# Patient Record
Sex: Female | Born: 1965 | Race: White | Hispanic: No | Marital: Married | Smoking: Never smoker
Health system: Southern US, Community
[De-identification: ages and names within clinical notes are randomized; demographics above are authoritative.]

## PROBLEM LIST (undated history)

## (undated) DIAGNOSIS — F439 Reaction to severe stress, unspecified: Secondary | ICD-10-CM

## (undated) DIAGNOSIS — M2550 Pain in unspecified joint: Secondary | ICD-10-CM

## (undated) DIAGNOSIS — D219 Benign neoplasm of connective and other soft tissue, unspecified: Secondary | ICD-10-CM

## (undated) DIAGNOSIS — F3281 Premenstrual dysphoric disorder: Secondary | ICD-10-CM

## (undated) DIAGNOSIS — E789 Disorder of lipoprotein metabolism, unspecified: Secondary | ICD-10-CM

## (undated) DIAGNOSIS — Z9289 Personal history of other medical treatment: Secondary | ICD-10-CM

## (undated) DIAGNOSIS — E559 Vitamin D deficiency, unspecified: Secondary | ICD-10-CM

## (undated) DIAGNOSIS — Z6841 Body Mass Index (BMI) 40.0 and over, adult: Secondary | ICD-10-CM

## (undated) DIAGNOSIS — N920 Excessive and frequent menstruation with regular cycle: Secondary | ICD-10-CM

## (undated) HISTORY — PX: KNEE ARTHROSCOPY: SUR90

## (undated) HISTORY — DX: Personal history of other medical treatment: Z92.89

## (undated) HISTORY — DX: Benign neoplasm of connective and other soft tissue, unspecified: D21.9

## (undated) HISTORY — DX: Body Mass Index (BMI) 40.0 and over, adult: Z684

## (undated) HISTORY — DX: Premenstrual dysphoric disorder: F32.81

## (undated) HISTORY — DX: Vitamin D deficiency, unspecified: E55.9

## (undated) HISTORY — DX: Excessive and frequent menstruation with regular cycle: N92.0

## (undated) HISTORY — DX: Reaction to severe stress, unspecified: F43.9

## (undated) HISTORY — DX: Pain in unspecified joint: M25.50

## (undated) HISTORY — DX: Disorder of lipoprotein metabolism, unspecified: E78.9

---

## 2007-03-14 HISTORY — PX: CRYOABLATION: SHX1415

## 2009-10-25 ENCOUNTER — Ambulatory Visit: Payer: Self-pay | Admitting: Obstetrics and Gynecology

## 2009-10-31 ENCOUNTER — Inpatient Hospital Stay: Payer: Self-pay | Admitting: Obstetrics and Gynecology

## 2009-10-31 HISTORY — PX: CYSTOSCOPY: SUR368

## 2009-10-31 HISTORY — PX: ABDOMINAL HYSTERECTOMY: SHX81

## 2010-03-03 ENCOUNTER — Ambulatory Visit: Payer: Self-pay | Admitting: Unknown Physician Specialty

## 2010-03-26 ENCOUNTER — Ambulatory Visit: Payer: Self-pay | Admitting: Unknown Physician Specialty

## 2011-11-09 DIAGNOSIS — E559 Vitamin D deficiency, unspecified: Secondary | ICD-10-CM

## 2011-11-09 DIAGNOSIS — E789 Disorder of lipoprotein metabolism, unspecified: Secondary | ICD-10-CM

## 2011-11-09 HISTORY — DX: Vitamin D deficiency, unspecified: E55.9

## 2011-11-09 HISTORY — DX: Disorder of lipoprotein metabolism, unspecified: E78.9

## 2011-12-28 IMAGING — CT CT ABD-PELV W/ CM
1 of 3 series · 12 of 32 positions shown, 18 images · IV contrast (isovue)
Comparison: None

REASON FOR EXAM: COMMENTS:

PROCEDURE:     CT  - CT ABDOMEN / PELVIS  W  - November 01, 2009  [DATE]
RESULT:      History: Decreased urine output
TECHNIQUE: Multiple axial images of the abdomen and pelvis were performed
from the lung bases to the pubic symphysis, without p.o. contrast and with
100 ml of Isovue 370 intravenous contrast in the nephrographic and excretory
phases.

[Series 2: soft tissue · axial · 0.82mm/px · z∈[-534,-144]mm · 12 of 92 slices shown, 18 images]
[im 7/92  soft-tissue]
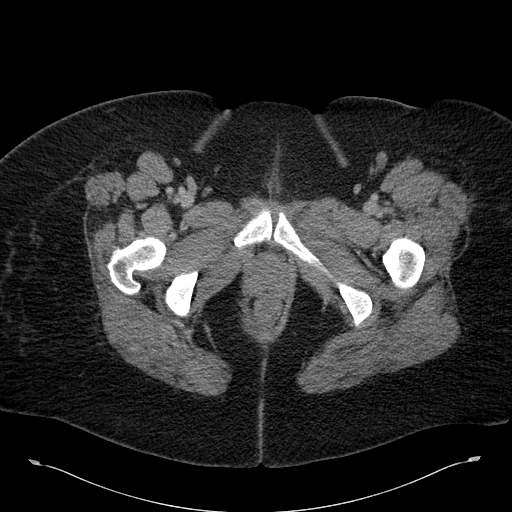
[im 7/92  bone]
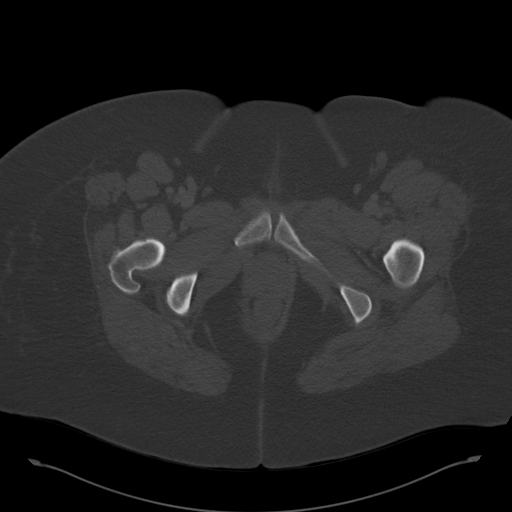
[im 14/92  soft-tissue]
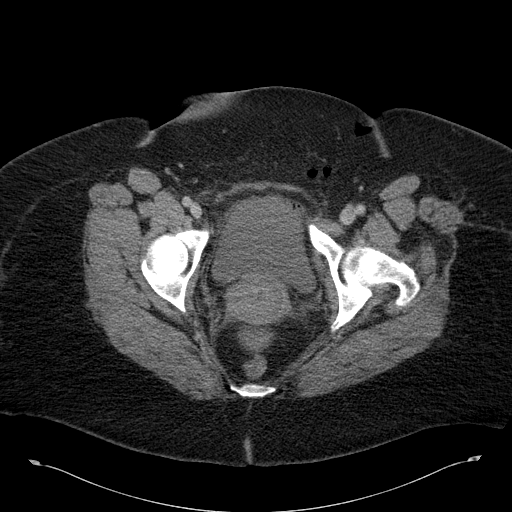
[im 20/92  soft-tissue]
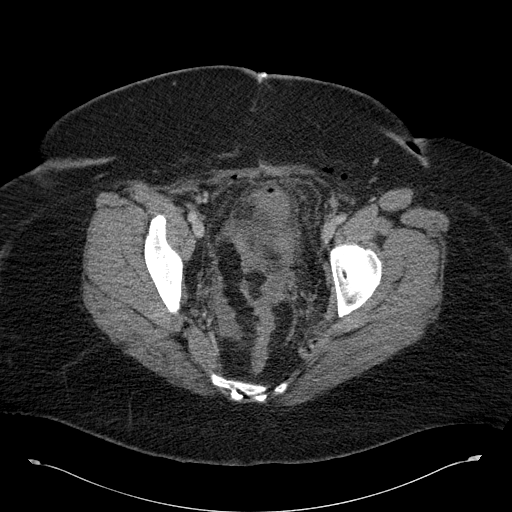
[im 27/92  soft-tissue]
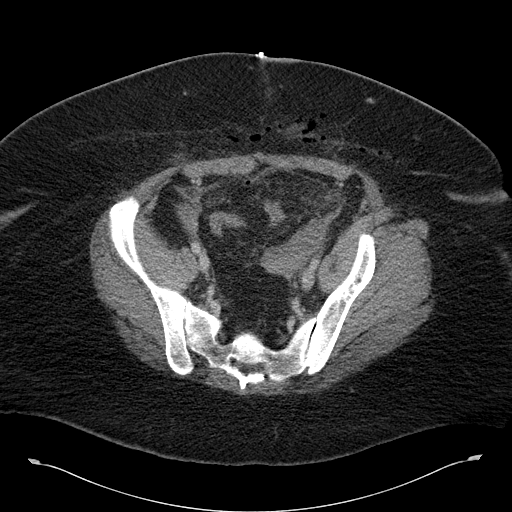
[im 33/92  soft-tissue]
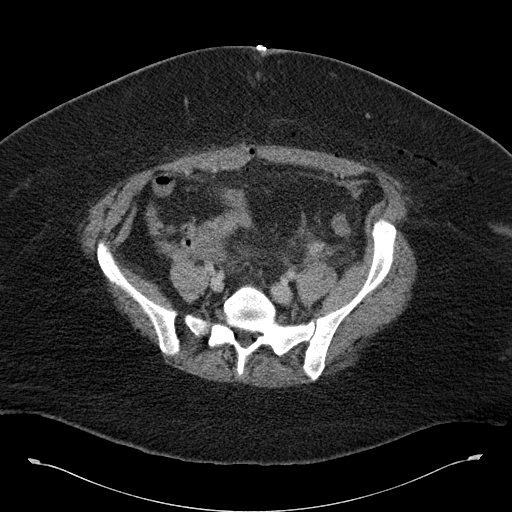
[im 40/92  soft-tissue]
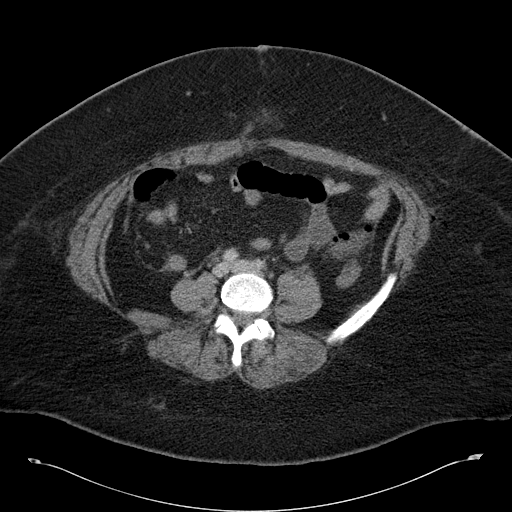
[im 53/92  soft-tissue]
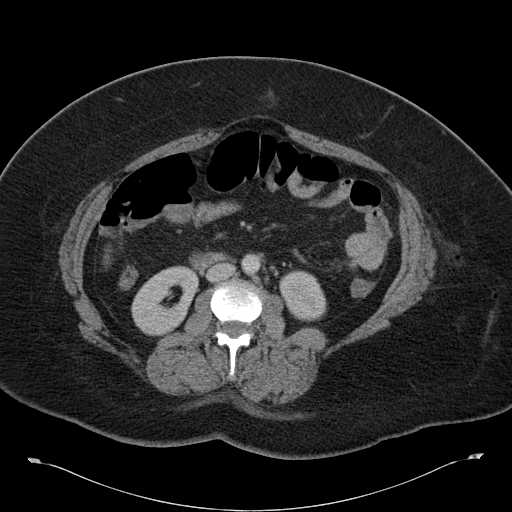
[im 59/92  soft-tissue]
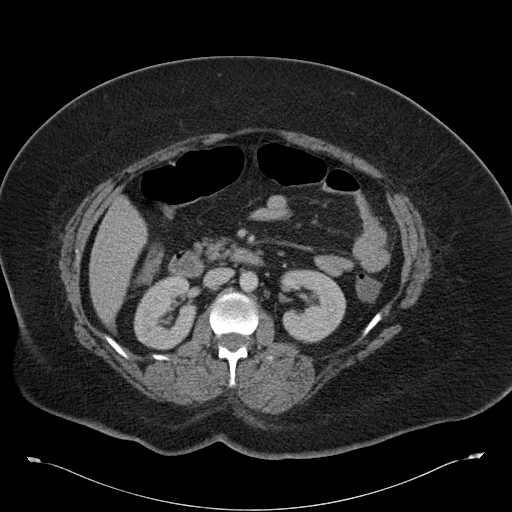
[im 66/92  soft-tissue]
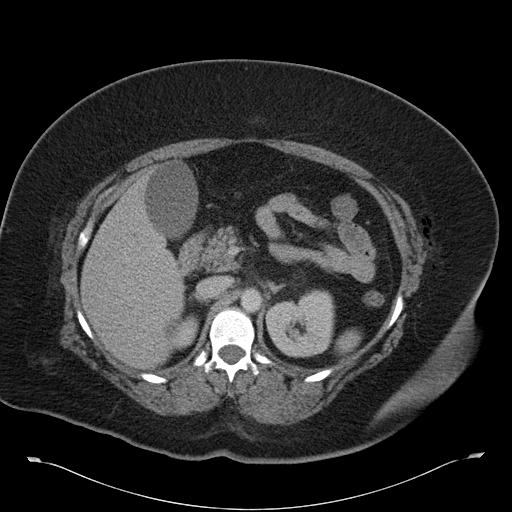
[im 66/92  lung]
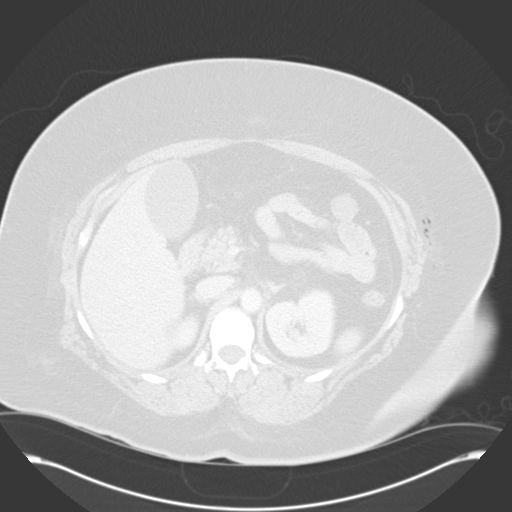
[im 66/92  bone]
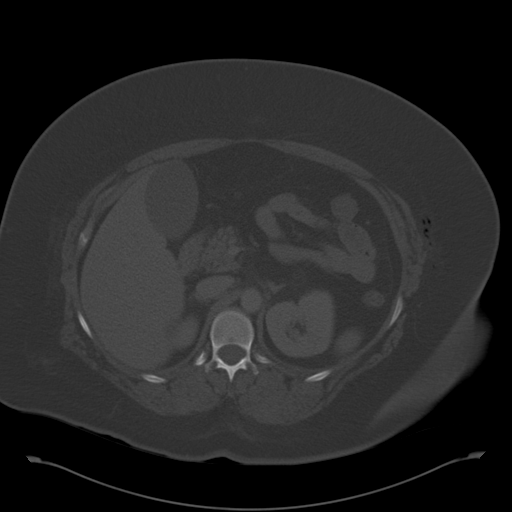
[im 72/92  soft-tissue]
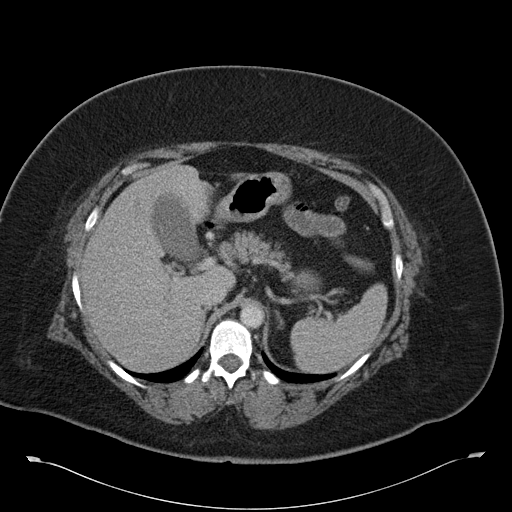
[im 72/92  lung]
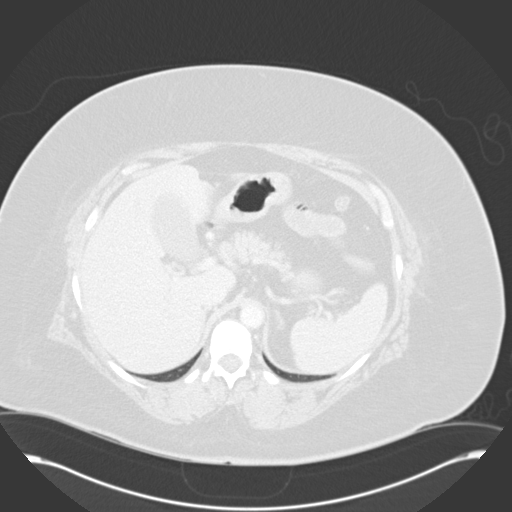
[im 79/92  soft-tissue]
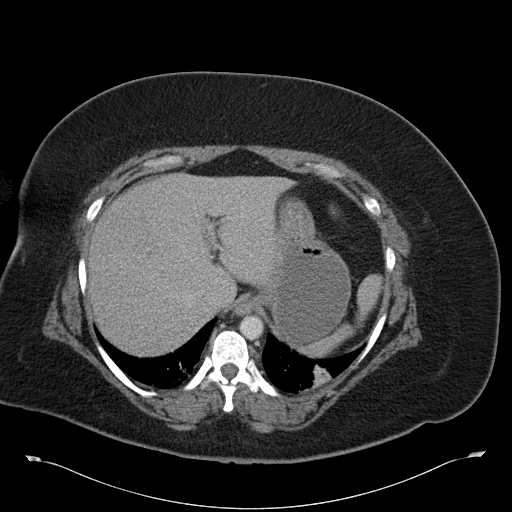
[im 79/92  lung]
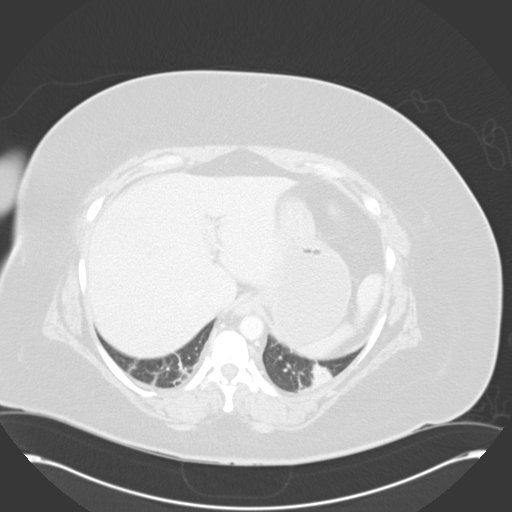
[im 85/92  soft-tissue]
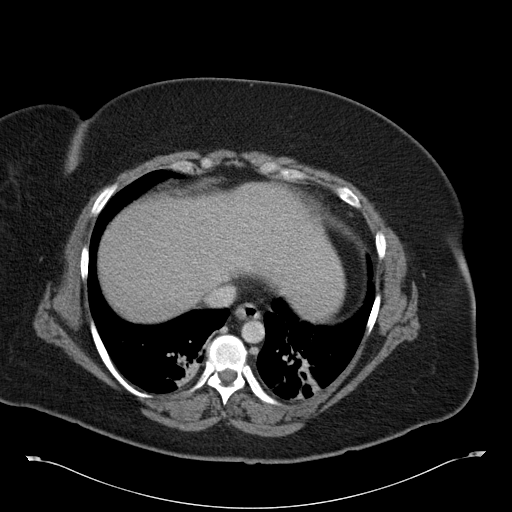
[im 85/92  lung]
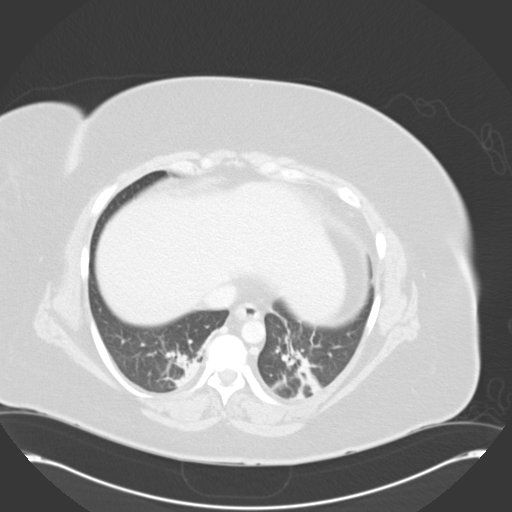

[12 of 32 positions shown; findings below may reference images not displayed]

FINDINGS: There is bibasilar airspace disease likely resenting atelectasis. There is
no pneumothorax. The heart size is normal.

The liver demonstrates no focal abnormality. There is no intrahepatic or
extrahepatic biliary ductal dilatation. The gallbladder is unremarkable. The
spleen demonstrates no focal abnormality. The kidneys, adrenal glands, and
pancreas are normal. The bladder is unremarkable. Bilateral ureters are
normal in size with contrast demonstrated throughout there courses. There is
no contrast extravasation.

There is high attenuation fluid in the left side of the upper and lower
pelvis likely representing a small amount of hemorrhage which is likely
related to recent surgery. There are postsurgical changes in the anterior
abdominal subcutaneous fat with multiple locules of air seen.

The unopacified stomach, duodenum, small intestine, and large intestine
demonstrate no gross abnormality.  There is no pneumoperitoneum,
pneumatosis, or portal venous gas. There is no abdominal or pelvic free
fluid. There is no lymphadenopathy.

The abdominal aorta is normal in caliber with atherosclerosis.

The osseous structures are unremarkable.
IMPRESSION: 1. There is no evidence of a ureteral or bladder injury. No contrast
extravasation. There is a single locule of air within the bladder which may
be secondary to recent instrumentation versus infection. Correlate with
clinical findings.

2. There is high attenuation fluid in the left side of the upper and lower
pelvis likely representing a small amount of hemorrhage which is likely
related to recent surgery. There are postsurgical changes in the anterior
abdominal subcutaneous fat with multiple locules of air seen.

3. There is bibasilar airspace disease likely resenting atelectasis.

## 2012-04-28 IMAGING — CR DG CHEST 2V
1 series · 2 of 2 positions shown · non-contrast
Comparison: none

REASON FOR EXAM: constant cough
COMMENTS:

[Series 1: view not recorded · 0.17mm/px · 2 of 2 slices shown]
[im 1/2]
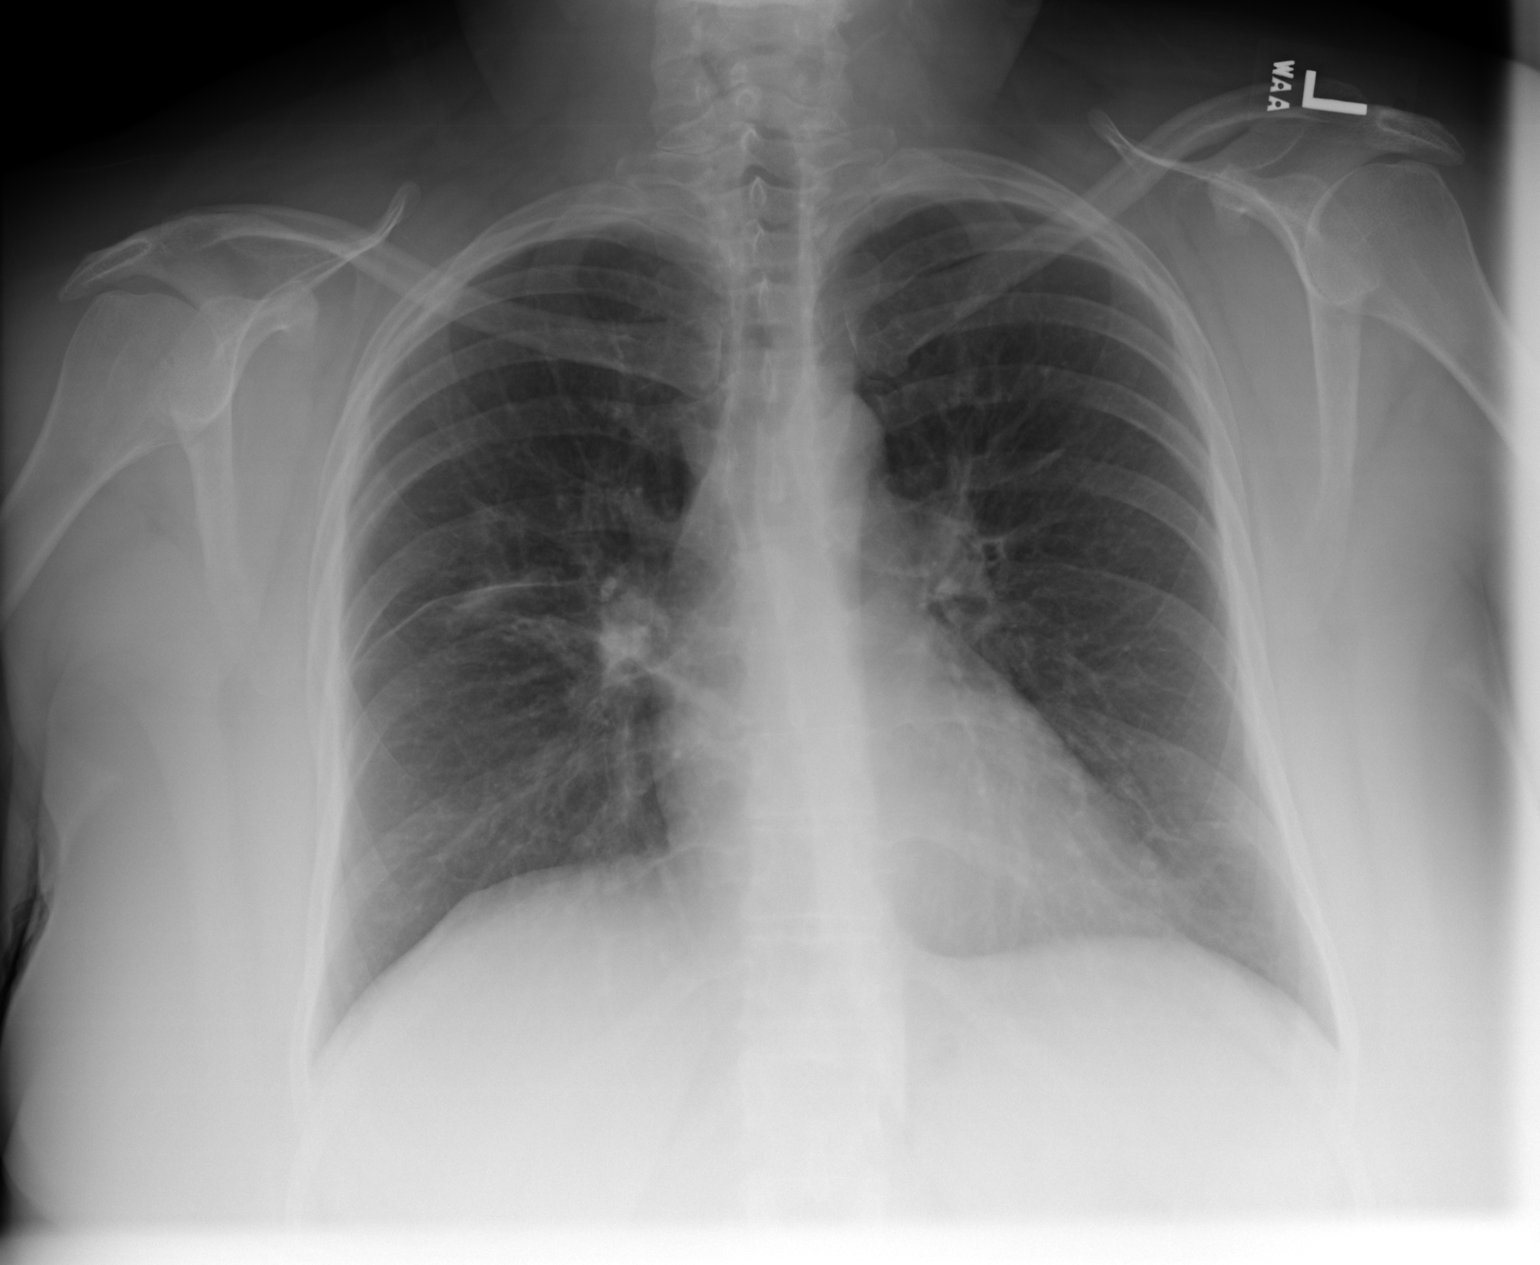
[im 2/2]
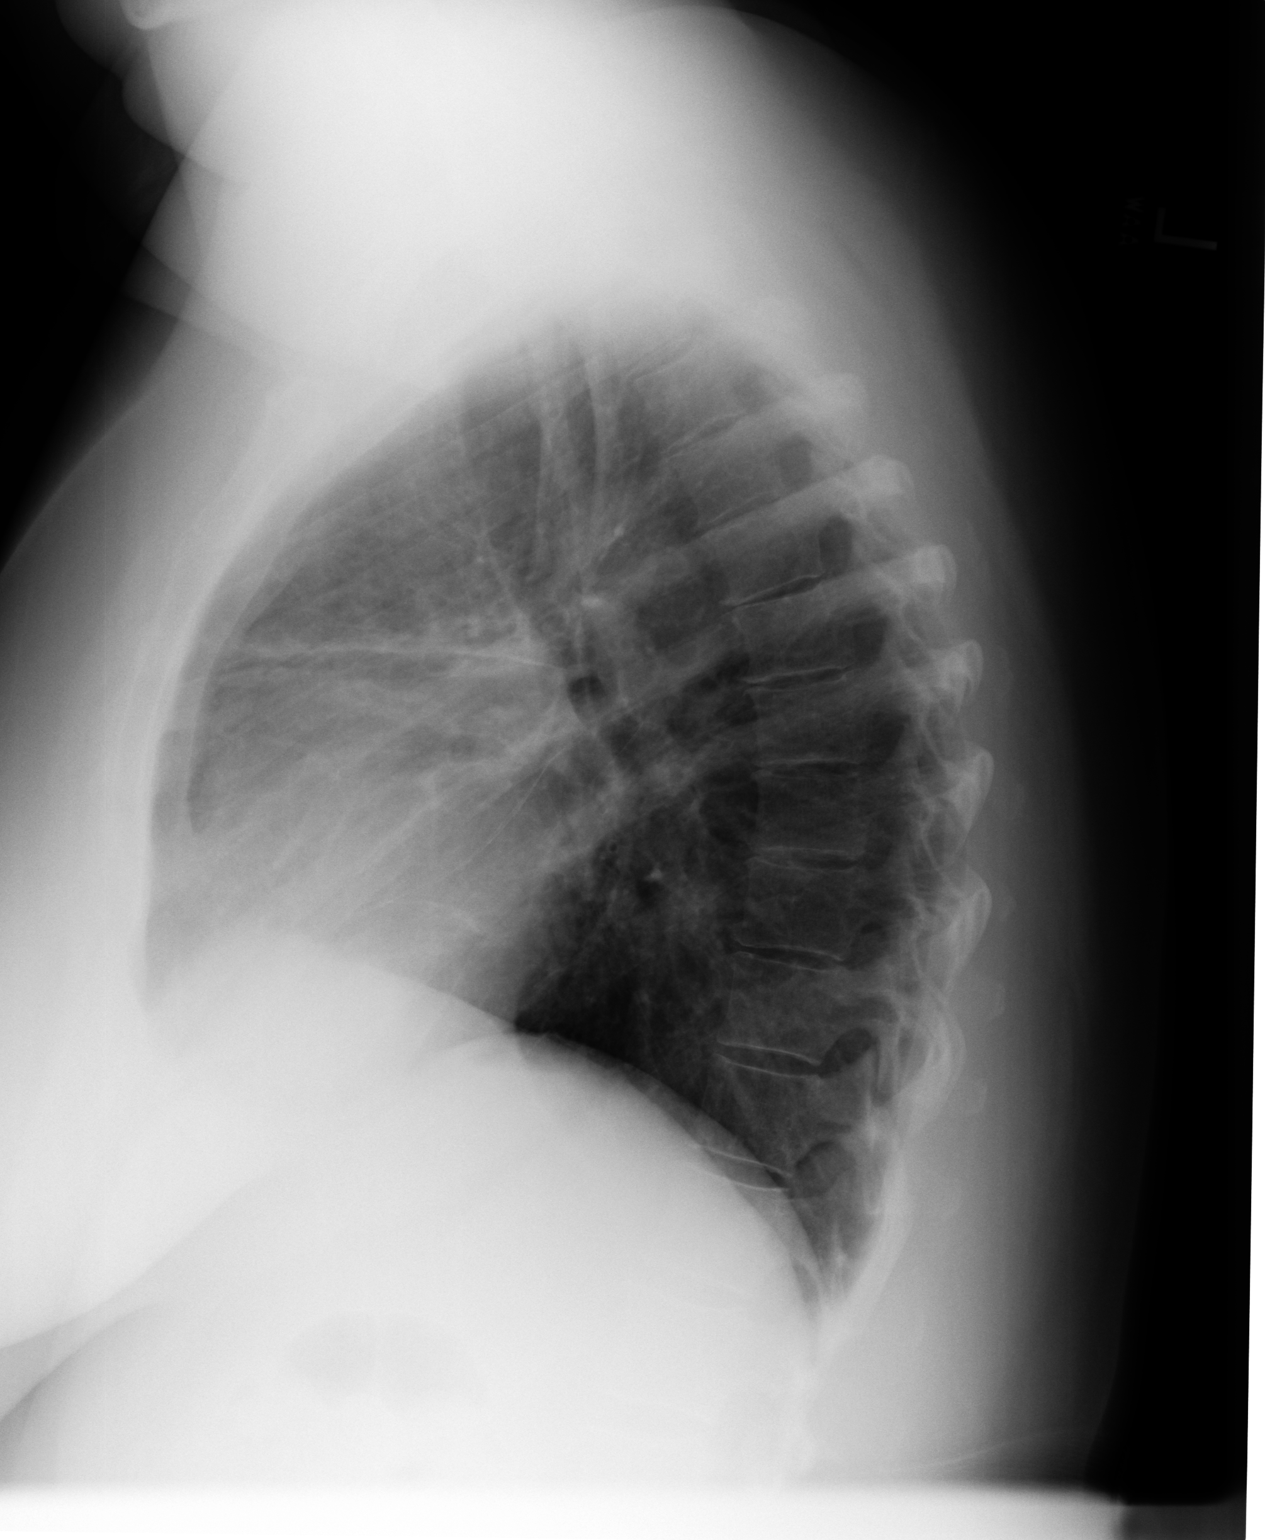

[2 of 2 positions shown; findings below may reference images not displayed]

PROCEDURE:     DXR - DXR CHEST PA (OR AP) AND LATERAL  - March 03, 2010  [DATE]

RESULT:     There is a patchy increase in density in the right mid lung
field compatible with atelectasis or pneumonia. The bibasilar infiltrates or
atelectasis, observed on the prior abdominal CT of 11/01/2009, is no longer
seen. The heart size is normal. The mediastinal and osseous structures are
normal in appearance.
IMPRESSION: 1.  There is increased density lateral to the right hilum consistent with
infiltrate or atelectasis. Follow-up examination until clear is recommended.
2.  The left lung field is clear.
3.  The heart size is normal.

## 2013-06-11 DIAGNOSIS — F3281 Premenstrual dysphoric disorder: Secondary | ICD-10-CM

## 2013-06-11 HISTORY — DX: Premenstrual dysphoric disorder: F32.81

## 2013-10-26 ENCOUNTER — Ambulatory Visit: Payer: Self-pay | Admitting: Podiatry

## 2015-06-18 DIAGNOSIS — I7300 Raynaud's syndrome without gangrene: Secondary | ICD-10-CM | POA: Insufficient documentation

## 2015-12-09 LAB — HM PAP SMEAR

## 2015-12-09 LAB — HM MAMMOGRAPHY

## 2016-12-21 ENCOUNTER — Encounter: Payer: Self-pay | Admitting: Obstetrics and Gynecology

## 2016-12-21 ENCOUNTER — Telehealth: Payer: Self-pay | Admitting: Obstetrics and Gynecology

## 2016-12-21 ENCOUNTER — Ambulatory Visit (INDEPENDENT_AMBULATORY_CARE_PROVIDER_SITE_OTHER): Payer: BC Managed Care – PPO | Admitting: Obstetrics and Gynecology

## 2016-12-21 VITALS — BP 128/78 | Ht 63.0 in | Wt 227.0 lb

## 2016-12-21 DIAGNOSIS — Z1231 Encounter for screening mammogram for malignant neoplasm of breast: Secondary | ICD-10-CM | POA: Diagnosis not present

## 2016-12-21 DIAGNOSIS — Z1239 Encounter for other screening for malignant neoplasm of breast: Secondary | ICD-10-CM

## 2016-12-21 DIAGNOSIS — Z1211 Encounter for screening for malignant neoplasm of colon: Secondary | ICD-10-CM | POA: Diagnosis not present

## 2016-12-21 DIAGNOSIS — Z01419 Encounter for gynecological examination (general) (routine) without abnormal findings: Secondary | ICD-10-CM

## 2016-12-21 NOTE — Addendum Note (Signed)
Addended by: Ardeth Perfect B on: 01/28/1006 11:50 AM   Modules accepted: Orders

## 2016-12-21 NOTE — Telephone Encounter (Signed)
-----   Message -----    From: Candida Peeling. Fader    Sent: 12/21/2016 11:09 AM EDT      To: Patient HM Schedule Request Mailing List Subject: Appointment Request (HM)  Appointment Request From: Candida Peeling. Mancel Bale  With Provider: Ardeth Perfect, PA-C [[facility]]  Preferred Date Range: Any date 05/06/2017 or later  Preferred Times: Any  Reason: To address the following health maintenance concerns. Colonoscopy  Comments:

## 2016-12-21 NOTE — Progress Notes (Addendum)
Chief Complaint  Patient presents with  . Annual Exam    HPI:      [name] is a 51 y.o. [phone] who LMP was Patient's last menstrual period was 11/20/2016., presents today for her annual examination.  Her menses are regular every 28-30 days, lasting 3 days. She is s/p supracx hyst but has continuesd to have light spotting monthly since procedure. Dysmenorrhea none. She does not have intermenstrual bleeding.  She does not have vasomotor sx.   Sex activity: single partner, contraception - status post hysterectomy. She does not have vaginal dryness.  Last Pap: December 09, 2015  Results were: no abnormalities /neg HPV DNA.  Hx of STDs: none  Last mammogram: December 20, 2015  Results were: normal--routine follow-up in 12 months There is no FH of breast cancer. There is no FH of ovarian cancer. The patient does do self-breast exams.  Colonoscopy: never; pt to consider having one.  Tobacco use: The patient denies current or previous tobacco use. Alcohol use: none Exercise: moderately active  She does get adequate calcium and Vitamin D in her diet.   Past Medical History:  Diagnosis Date  . Fibroid   . History of mammogram 12/07/2014; 12/09/15   CAT I; ADD VIEWS - BENIGN  . History of Papanicolaou smear of cervix 11/25/12; 12/09/15   -/-; -/-  . Joint pain   . 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 Menorrhagia   . PMDD (premenstrual dysphoric disorder) 06/2013   STARTED ON ZOLOFT  . Stress   . Vitamin D deficiency 11/2011    Past Surgical History:  Procedure Laterality Date  . ABDOMINAL HYSTERECTOMY  10/31/2009   TAH, SCHED AS LSH/CONVERTED TO TAH UTERIS W/O CERVIX 2ND TO FIBROIDS AND ENLARGED UTERUS  . CRYOABLATION  03/14/2007   HER OPTIONS FOR MENORRHAGIA  . CYSTOSCOPY  10/31/2009   AFTER TAH TO ENSURE URETERAL PATENCY    Family History  Problem Relation Age of Onset  . Cancer Father        [facility]    Social History   Social  History  . Marital status: Married    Spouse name: N/A  . Number of children: 3  . Years of education: 46   Occupational History  . TEACHER    Social History Main Topics  . Smoking status: Never Smoker  . Smokeless tobacco: Never Used  . Alcohol use No  . Drug use: No  . Sexual activity: Yes    Birth control/ protection: None   Other Topics Concern  . Not on file   Social History Narrative  . No narrative on file     Current Outpatient Prescriptions:  Marland Kitchen  Multiple Vitamins-Minerals (QC WOMENS DAILY MULTIVITAMIN) TABS, Take by mouth., Disp: , Rfl:  .  vitamin B-12 (CYANOCOBALAMIN) 1000 MCG tablet, Take 1,000 mcg by mouth daily., Disp: , Rfl:  .  fexofenadine (ALLEGRA ALLERGY) 60 MG tablet, Take 60 mg by mouth 2 (two) times daily., Disp: , Rfl:  .  Pyridoxine HCl (VITAMIN B-6) 250 MG tablet, Take 250 mg by mouth daily., Disp: , Rfl:    ROS:  Review of Systems  Constitutional: Negative for fatigue, fever and unexpected weight change.  Respiratory: Negative for cough, shortness of breath and wheezing.   Cardiovascular: Negative for chest pain, palpitations and leg swelling.  Gastrointestinal: Negative for blood in stool, constipation, diarrhea, nausea and vomiting.  Endocrine: Negative for cold intolerance, heat intolerance and polyuria.  Genitourinary:  Negative for dyspareunia, dysuria, flank pain, frequency, genital sores, hematuria, menstrual problem, pelvic pain, urgency, vaginal bleeding, vaginal discharge and vaginal pain.  Musculoskeletal: Negative for back pain, joint swelling and myalgias.  Skin: Negative for rash.  Neurological: Negative for dizziness, syncope, light-headedness, numbness and headaches.  Hematological: Negative for adenopathy.  Psychiatric/Behavioral: Negative for agitation, confusion, sleep disturbance and suicidal ideas. The patient is not nervous/anxious.      Objective: BP 128/78   Ht 5\' 3"  (1.6 m)   Wt 227 lb (103 kg)   LMP 11/20/2016    BMI 40.21 kg/m    Physical Exam  Constitutional: She is oriented to person, place, and time. She appears well-developed and well-nourished.  Genitourinary: Vagina normal. There is no rash or tenderness on the right labia. There is no rash or tenderness on the left labia. No erythema or tenderness in the vagina. No vaginal discharge found. Right adnexum does not display mass and does not display tenderness. Left adnexum does not display mass and does not display tenderness. Cervix does not exhibit motion tenderness or polyp.  Genitourinary Comments: UTERUS  SURG REM  Neck: Normal range of motion. No thyromegaly present.  Cardiovascular: Normal rate, regular rhythm and normal heart sounds.   No murmur heard. Pulmonary/Chest: Effort normal and breath sounds normal. Right breast exhibits no mass, no nipple discharge, no skin change and no tenderness. Left breast exhibits no mass, no nipple discharge, no skin change and no tenderness.  Abdominal: Soft. There is no tenderness. There is no guarding.  Musculoskeletal: Normal range of motion.  Neurological: She is alert and oriented to person, place, and time. No cranial nerve deficit.  Psychiatric: She has a normal mood and affect. Her behavior is normal.  Vitals reviewed.   Assessment/Plan:  Encounter for annual routine gynecological examination  Screening for breast cancer - Pt to sched mammo at [place]: MM DIGITAL SCREENING BILATERAL  Screening for colon cancer - Recommended colonoscopy due to age. Izora Gala to do ref. - Plan: Ambulatory referral to Gastroenterology          GYN counsel mammography screening, adequate intake of calcium and vitamin D    F/U  Return in about 1 year (around 12/21/2017).  Rachell Druckenmiller B. Nastacia Raybuck, PA-C 12/21/2016 11:50 AM

## 2016-12-21 NOTE — Telephone Encounter (Signed)
Ref order sent to you. Pt wants procedure in Dec when on break but go ahead and sched consult. Thx.

## 2016-12-24 ENCOUNTER — Encounter: Payer: Self-pay | Admitting: Obstetrics and Gynecology

## 2016-12-24 ENCOUNTER — Telehealth: Payer: Self-pay | Admitting: Gastroenterology

## 2016-12-24 NOTE — Telephone Encounter (Signed)
Please call cell phone to schedule colonoscopy. She does have a referral in and wants to go ahead and get scheduled. Please call

## 2016-12-25 ENCOUNTER — Other Ambulatory Visit: Payer: Self-pay

## 2016-12-25 DIAGNOSIS — J329 Chronic sinusitis, unspecified: Secondary | ICD-10-CM | POA: Insufficient documentation

## 2016-12-25 DIAGNOSIS — Z1211 Encounter for screening for malignant neoplasm of colon: Secondary | ICD-10-CM

## 2016-12-25 NOTE — Telephone Encounter (Signed)
Gastroenterology Pre-Procedure Review  Request Date: 12/20 Requesting Physician: Dr. Marius Ditch  PATIENT REVIEW QUESTIONS: The patient responded to the following health history questions as indicated:    1. Are you having any GI issues? no 2. Do you have a personal history of Polyps? no 3. Do you have a family history of Colon Cancer or Polyps? no 4. Diabetes Mellitus? no 5. Joint replacements in the past 12 months?no 6. Major health problems in the past 3 months?no 7. Any artificial heart valves, MVP, or defibrillator?no    MEDICATIONS & ALLERGIES:    Patient reports the following regarding taking any anticoagulation/antiplatelet therapy:   Plavix, Coumadin, Eliquis, Xarelto, Lovenox, Pradaxa, Brilinta, or Effient? no Aspirin? no  Patient confirms/reports the following medications:  Current Outpatient Prescriptions  Medication Sig Dispense Refill  . benzonatate (TESSALON) 100 MG capsule benzonatate 100 mg capsule    . cefdinir (OMNICEF) 300 MG capsule cefdinir 300 mg capsule    . chlorpheniramine-HYDROcodone (TUSSIONEX) 10-8 MG/5ML SUER hydrocodone 10 mg-chlorpheniramine 8 mg/5 mL oral susp extend.rel 12hr    . Cholecalciferol (VITAMIN D3) 1000 units CAPS Take by mouth.    . doxycycline (VIBRAMYCIN) 100 MG capsule doxycycline hyclate 100 mg capsule    . fexofenadine (ALLEGRA ALLERGY) 60 MG tablet Take 60 mg by mouth 2 (two) times daily.    . fexofenadine-pseudoephedrine (ALLEGRA-D) 60-120 MG 12 hr tablet Take by mouth.    . levofloxacin (LEVAQUIN) 750 MG tablet levofloxacin 750 mg tablet    . Multiple Vitamins-Minerals (QC WOMENS DAILY MULTIVITAMIN) TABS Take by mouth.    . Pyridoxine HCl (VITAMIN B-6) 250 MG tablet Take 250 mg by mouth daily.    . vitamin B-12 (CYANOCOBALAMIN) 1000 MCG tablet Take 1,000 mcg by mouth daily.     No current facility-administered medications for this visit.     Patient confirms/reports the following allergies:  Allergies  Allergen Reactions  .  Augmentin [Amoxicillin-Pot Clavulanate] Other (See Comments)    No orders of the defined types were placed in this encounter.   AUTHORIZATION INFORMATION Primary Insurance: 1D#: Group #:  Secondary Insurance: 1D#: Group #:  SCHEDULE INFORMATION: Date: 12/20 Time: Location: [place]

## 2016-12-28 ENCOUNTER — Encounter: Payer: Self-pay | Admitting: Obstetrics and Gynecology

## 2017-01-19 DIAGNOSIS — M1711 Unilateral primary osteoarthritis, right knee: Secondary | ICD-10-CM | POA: Insufficient documentation

## 2017-04-20 ENCOUNTER — Telehealth: Payer: Self-pay

## 2017-04-20 NOTE — Telephone Encounter (Signed)
Pts colonoscopy has been rescheduled due to her insurance.  Her new date is February 7th at [facility] Dr. Marius Ditch.  New instructions will be mailed.  Thanks [employer]

## 2017-06-14 ENCOUNTER — Telehealth: Payer: Self-pay | Admitting: Gastroenterology

## 2017-06-14 NOTE — Telephone Encounter (Signed)
LVM for patient to contact the office to reschedule her colonoscopy instead of canceling we could move the date to June.  Her original date has not been canceled at this time.  Thanks [employer]

## 2017-06-14 NOTE — Telephone Encounter (Signed)
PATIENT CALLED AND WANTS TO CANCE HER COLONOSCOPY 06-17-17 WITH DR Marius Ditch.SHE WOULD LIKE TO RESCHEDULE AFTER 10-22-17.PLEASE CALL [phone] TO RESCHEDULE.

## 2017-06-16 ENCOUNTER — Telehealth: Payer: Self-pay | Admitting: Gastroenterology

## 2017-06-16 NOTE — Telephone Encounter (Signed)
Patient Sarah Lambert and needs to r/s her procedure for tomorrow 06/17/17.

## 2017-06-16 NOTE — Telephone Encounter (Signed)
Returned patients call to reschedule her colonoscopy however I was unable to leave a message because her voice mail has not been set up.

## 2017-06-17 ENCOUNTER — Ambulatory Visit
Admission: RE | Admit: 2017-06-17 | Payer: BC Managed Care – PPO | Source: Ambulatory Visit | Admitting: Gastroenterology

## 2017-06-17 ENCOUNTER — Encounter: Admission: RE | Payer: Self-pay | Source: Ambulatory Visit

## 2017-06-17 SURGERY — COLONOSCOPY WITH PROPOFOL
Anesthesia: General

## 2017-10-05 DIAGNOSIS — M7540 Impingement syndrome of unspecified shoulder: Secondary | ICD-10-CM | POA: Insufficient documentation

## 2017-12-22 ENCOUNTER — Encounter: Payer: Self-pay | Admitting: Obstetrics and Gynecology

## 2017-12-22 ENCOUNTER — Ambulatory Visit (INDEPENDENT_AMBULATORY_CARE_PROVIDER_SITE_OTHER): Payer: BC Managed Care – PPO | Admitting: Obstetrics and Gynecology

## 2017-12-22 VITALS — BP 110/80 | HR 75 | Ht 63.0 in | Wt 238.0 lb

## 2017-12-22 DIAGNOSIS — Z0000 Encounter for general adult medical examination without abnormal findings: Secondary | ICD-10-CM

## 2017-12-22 DIAGNOSIS — Z1211 Encounter for screening for malignant neoplasm of colon: Secondary | ICD-10-CM | POA: Diagnosis not present

## 2017-12-22 DIAGNOSIS — F5101 Primary insomnia: Secondary | ICD-10-CM | POA: Diagnosis not present

## 2017-12-22 DIAGNOSIS — Z01419 Encounter for gynecological examination (general) (routine) without abnormal findings: Secondary | ICD-10-CM

## 2017-12-22 DIAGNOSIS — Z1231 Encounter for screening mammogram for malignant neoplasm of breast: Secondary | ICD-10-CM | POA: Diagnosis not present

## 2017-12-22 DIAGNOSIS — Z13220 Encounter for screening for lipoid disorders: Secondary | ICD-10-CM

## 2017-12-22 DIAGNOSIS — Z01411 Encounter for gynecological examination (general) (routine) with abnormal findings: Secondary | ICD-10-CM

## 2017-12-22 DIAGNOSIS — Z131 Encounter for screening for diabetes mellitus: Secondary | ICD-10-CM

## 2017-12-22 DIAGNOSIS — Z1239 Encounter for other screening for malignant neoplasm of breast: Secondary | ICD-10-CM

## 2017-12-22 NOTE — Progress Notes (Addendum)
Chief Complaint  Patient presents with  . Gynecologic Exam    HPI:      [name] is a 52 y.o. [phone] who LMP was Patient's last menstrual period was 11/20/2016., presents today for her annual examination.  Her menses are regular every few months now (used to be monthly), lasting 2 days. She is s/p supracx hyst due to [facility] but has continues to have light spotting since procedure. Dysmenorrhea none. She does not have intermenstrual bleeding.  She does not have vasomotor sx.   Sex activity: single partner, contraception - status post hysterectomy. She does not have vaginal dryness.  Last Pap: December 09, 2015  Results were: no abnormalities /neg HPV DNA.  Hx of STDs: none  Last mammogram: 12/24/16 Results were: normal--routine follow-up in 12 months There is no FH of breast cancer. There is no FH of ovarian cancer. The patient does do self-breast exams.  Colonoscopy: never; pt interested in doing it over Christmas breast  Tobacco use: The patient denies current or previous tobacco use. Alcohol use: none Exercise: moderately active  She does get adequate calcium and Vitamin D in her diet. No recent labs.  Complains of insomnia, worse this summer since not working. Doesn't drink caffeine, exercises regularly, relaxes before bed. Tried melatonin 10 mg without relief. Has tried OTC sleep aids but still wakes up in the night. No trigger to wake up.    Past Medical History:  Diagnosis Date  . Fibroid   . History of mammogram 12/07/2014; 12/09/15   CAT I; ADD VIEWS - BENIGN  . History of Papanicolaou smear of cervix 11/25/12; 12/09/15   -/-; -/-  . Joint pain   . 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 Menorrhagia   . PMDD (premenstrual dysphoric disorder) 06/2013   STARTED ON ZOLOFT  . Stress   . Vitamin D deficiency 11/2011    Past Surgical History:  Procedure Laterality Date  . ABDOMINAL HYSTERECTOMY  10/31/2009   TAH, SCHED AS LSH/CONVERTED TO TAH UTERIS  W/O CERVIX 2ND TO FIBROIDS AND ENLARGED UTERUS  . CRYOABLATION  03/14/2007   HER OPTIONS FOR MENORRHAGIA  . CYSTOSCOPY  10/31/2009   AFTER TAH TO ENSURE URETERAL PATENCY    Family History  Problem Relation Age of Onset  . Cancer Father        [facility]    Social History   Socioeconomic History  . Marital status: Married    Spouse name: Not on file  . Number of children: 3  . Years of education: 55  . Highest education level: Not on file  Occupational History  . Occupation: TEACHER  Social Needs  . Financial resource strain: Not on file  . Food insecurity:    Worry: Not on file    Inability: Not on file  . Transportation needs:    Medical: Not on file    Non-medical: Not on file  Tobacco Use  . Smoking status: Never Smoker  . Smokeless tobacco: Never Used  Substance and Sexual Activity  . Alcohol use: No  . Drug use: No  . Sexual activity: Yes    Birth control/protection: None, Surgical    Comment: Hysterectomy  Lifestyle  . Physical activity:    Days per week: Not on file    Minutes per session: Not on file  . Stress: Not on file  Relationships  . Social connections:    Talks on phone: Not on file    Gets together:  Not on file    Attends religious service: Not on file    Active member of club or organization: Not on file    Attends meetings of clubs or organizations: Not on file    Relationship status: Not on file  . Intimate partner violence:    Fear of current or ex partner: Not on file    Emotionally abused: Not on file    Physically abused: Not on file    Forced sexual activity: Not on file  Other Topics Concern  . Not on file  Social History Narrative  . Not on file     Current Outpatient Medications:  .  Cholecalciferol (VITAMIN D3) 1000 units CAPS, Take by mouth., Disp: , Rfl:  .  Citalopram Hydrobromide (CELEXA PO), Celexa, Disp: , Rfl:  .  fexofenadine (ALLEGRA ALLERGY) 60 MG tablet, Take 60 mg by mouth 2 (two) times daily., Disp:  , Rfl:  .  Glucosamine-Chondroitin 250-200 MG TABS, Take by mouth., Disp: , Rfl:  .  KRILL OIL/ASTAXANTHIN 1000 MG CAPS, Take by mouth., Disp: , Rfl:  .  meloxicam (MOBIC) 15 MG tablet, Take 15 mg by mouth daily., Disp: , Rfl: 2 .  Misc Natural Products (IN-FLA-MEND) CAPS, Take by mouth., Disp: , Rfl:  .  Multiple Vitamins-Minerals (QC WOMENS DAILY MULTIVITAMIN) TABS, Take by mouth., Disp: , Rfl:  .  Pyridoxine HCl (VITAMIN B-6) 250 MG tablet, Take 250 mg by mouth daily., Disp: , Rfl:  .  SODIUM & POTASSIUM BICARBONATE PO, Take by mouth., Disp: , Rfl:  .  vitamin B-12 (CYANOCOBALAMIN) 1000 MCG tablet, Take 1,000 mcg by mouth daily., Disp: , Rfl:  .  omeprazole (PRILOSEC) 20 MG capsule, Take by mouth., Disp: , Rfl:    ROS:  Review of Systems  Constitutional: Negative for fatigue, fever and unexpected weight change.  Respiratory: Negative for cough, shortness of breath and wheezing.   Cardiovascular: Negative for chest pain, palpitations and leg swelling.  Gastrointestinal: Negative for blood in stool, constipation, diarrhea, nausea and vomiting.  Endocrine: Negative for cold intolerance, heat intolerance and polyuria.  Genitourinary: Negative for dyspareunia, dysuria, flank pain, frequency, genital sores, hematuria, menstrual problem, pelvic pain, urgency, vaginal bleeding, vaginal discharge and vaginal pain.  Musculoskeletal: Negative for back pain, joint swelling and myalgias.  Skin: Negative for rash.  Neurological: Negative for dizziness, syncope, light-headedness, numbness and headaches.  Hematological: Negative for adenopathy.  Psychiatric/Behavioral: Negative for agitation, confusion, sleep disturbance and suicidal ideas. The patient is not nervous/anxious.      Objective: BP 110/80   Pulse 75   Ht 5\' 3"  (1.6 m)   Wt 238 lb (108 kg)   LMP 11/20/2016   BMI 42.16 kg/m    Physical Exam  Constitutional: She is oriented to person, place, and time. She appears well-developed  and well-nourished.  Genitourinary: Vagina normal. There is no rash or tenderness on the right labia. There is no rash or tenderness on the left labia. No erythema or tenderness in the vagina. No vaginal discharge found. Right adnexum does not display mass and does not display tenderness. Left adnexum does not display mass and does not display tenderness. Cervix does not exhibit motion tenderness or polyp.  Genitourinary Comments: UTERUS  SURG REM  Neck: Normal range of motion. No thyromegaly present.  Cardiovascular: Normal rate, regular rhythm and normal heart sounds.  No murmur heard. Pulmonary/Chest: Effort normal and breath sounds normal. Right breast exhibits no mass, no nipple discharge, no skin change and no  tenderness. Left breast exhibits no mass, no nipple discharge, no skin change and no tenderness.  Abdominal: Soft. There is no tenderness. There is no guarding.  Musculoskeletal: Normal range of motion.  Neurological: She is alert and oriented to person, place, and time. No cranial nerve deficit.  Psychiatric: She has a normal mood and affect. Her behavior is normal.  Vitals reviewed.   Assessment/Plan:  Encounter for annual routine gynecological examination  Screening for breast cancer - Pt has mammo sched - Plan: MM DIGITAL SCREENING BILATERAL  Screening for colon cancer - Refer to GI for scr colonoscopy due to age. - Plan: Ambulatory referral to Gastroenterology  Blood tests for routine general physical examination - Plan: Comprehensive metabolic panel, Lipid panel, Hemoglobin A1c  Screening cholesterol level - Plan: Lipid panel  Screening for diabetes mellitus - Plan: Hemoglobin A1c  Primary insomnia - See if sx improve once school and normal schedule resumes. F/u prn. May need Rx med.          GYN counsel mammography screening, adequate intake of calcium and vitamin D    F/U  Return in about 1 year (around 12/23/2018).  Arsenio Schnorr B. Joel Cowin, PA-C 12/22/2017 10:50  AM

## 2017-12-22 NOTE — Addendum Note (Signed)
Addended by: Ardeth Perfect B on: 12/10/2177 10:50 AM   Modules accepted: Orders

## 2017-12-22 NOTE — Patient Instructions (Addendum)
I value your feedback and entrusting us with your care. If you get a [facility] patient survey, I would appreciate you taking the time to let us know about your experience today. Thank you!  [facility]: [phone]  [facility]: [phone]  

## 2017-12-23 ENCOUNTER — Other Ambulatory Visit: Payer: Self-pay

## 2017-12-23 DIAGNOSIS — Z13220 Encounter for screening for lipoid disorders: Secondary | ICD-10-CM

## 2017-12-23 DIAGNOSIS — Z131 Encounter for screening for diabetes mellitus: Secondary | ICD-10-CM

## 2017-12-23 DIAGNOSIS — Z1211 Encounter for screening for malignant neoplasm of colon: Secondary | ICD-10-CM

## 2017-12-23 DIAGNOSIS — Z0000 Encounter for general adult medical examination without abnormal findings: Secondary | ICD-10-CM

## 2017-12-24 LAB — COMPREHENSIVE METABOLIC PANEL
A/G RATIO: 1.8 (ref 1.2–2.2)
ALK PHOS: 64 IU/L (ref 39–117)
ALT: 16 IU/L (ref 0–32)
AST: 23 IU/L (ref 0–40)
Albumin: 4.3 g/dL (ref 3.5–5.5)
BUN/Creatinine Ratio: 23 (ref 9–23)
BUN: 14 mg/dL (ref 6–24)
Bilirubin Total: 0.4 mg/dL (ref 0.0–1.2)
CALCIUM: 8.8 mg/dL (ref 8.7–10.2)
CO2: 20 mmol/L (ref 20–29)
Chloride: 107 mmol/L — ABNORMAL HIGH (ref 96–106)
Creatinine, Ser: 0.61 mg/dL (ref 0.57–1.00)
GFR calc Af Amer: 121 mL/min/{1.73_m2} (ref >59)
GFR, EST NON AFRICAN AMERICAN: 105 mL/min/{1.73_m2} (ref >59)
Globulin, Total: 2.4 g/dL (ref 1.5–4.5)
Glucose: 68 mg/dL (ref 65–99)
POTASSIUM: 4.3 mmol/L (ref 3.5–5.2)
Sodium: 142 mmol/L (ref 134–144)
Total Protein: 6.7 g/dL (ref 6.0–8.5)

## 2017-12-24 LAB — LIPID PANEL
CHOLESTEROL TOTAL: 188 mg/dL (ref 100–199)
Chol/HDL Ratio: 3.1 ratio (ref 0.0–4.4)
HDL: 61 mg/dL (ref >39)
LDL CALC: 94 mg/dL (ref 0–99)
Triglycerides: 163 mg/dL — ABNORMAL HIGH (ref 0–149)
VLDL CHOLESTEROL CAL: 33 mg/dL (ref 5–40)

## 2017-12-24 LAB — HEMOGLOBIN A1C
ESTIMATED AVERAGE GLUCOSE: 97 mg/dL
Hgb A1c MFr Bld: 5 % (ref 4.8–5.6)

## 2017-12-30 ENCOUNTER — Encounter: Payer: Self-pay | Admitting: Obstetrics and Gynecology

## 2018-01-19 ENCOUNTER — Encounter: Payer: Self-pay | Admitting: Obstetrics and Gynecology

## 2018-03-23 ENCOUNTER — Ambulatory Visit
Admission: RE | Admit: 2018-03-23 | Discharge: 2018-03-23 | Disposition: A | Discharge status: Home / Self Care | Payer: BC Managed Care – PPO | Source: Ambulatory Visit | Attending: Physician Assistant | Admitting: Physician Assistant

## 2018-03-23 ENCOUNTER — Other Ambulatory Visit: Payer: Self-pay | Admitting: Physician Assistant

## 2018-03-23 DIAGNOSIS — R059 Cough, unspecified: Secondary | ICD-10-CM

## 2018-03-23 DIAGNOSIS — J9811 Atelectasis: Secondary | ICD-10-CM | POA: Insufficient documentation

## 2018-03-23 DIAGNOSIS — R05 Cough: Secondary | ICD-10-CM | POA: Insufficient documentation

## 2018-04-05 ENCOUNTER — Encounter: Payer: Self-pay | Admitting: Internal Medicine

## 2018-04-05 ENCOUNTER — Ambulatory Visit: Payer: BC Managed Care – PPO | Admitting: Internal Medicine

## 2018-04-05 VITALS — BP 124/82 | HR 66 | Resp 16 | Ht 63.0 in | Wt 234.0 lb

## 2018-04-05 DIAGNOSIS — R05 Cough: Secondary | ICD-10-CM | POA: Diagnosis not present

## 2018-04-05 DIAGNOSIS — R059 Cough, unspecified: Secondary | ICD-10-CM

## 2018-04-05 MED ORDER — ALBUTEROL SULFATE HFA 108 (90 BASE) MCG/ACT IN AERS: 2.0000 | INHALATION_SPRAY | Freq: Four times a day (QID) | RESPIRATORY_TRACT | 2 refills | Status: AC | PRN

## 2018-04-05 NOTE — Progress Notes (Signed)
Name: Sarah Lambert MRN: [id number] DOB: [birth year]     CONSULTATION DATE: 11.26.19 REFERRING MD : Con Memos  CHIEF COMPLAINT: cough  STUDIES:     11.13.19 CXR independently reviewed by Me today Severe kyphosis Low lung volumes Subtle left lower opacity-atalectasis     05/2009 CT chest Independently reviewed by Me today B/l interstitial opacities likely scar tissues  HISTORY OF PRESENT ILLNESS: 52 year old pleasant white female seen today for cough Patient recently had a bout of upper respiratory tract infection with chest congestion cough was prescribed prednisone therapy along with inhaled steroid with Flovent and was given albuterol as needed  Patient symptoms resolved however she still had a rattle in her chest  Patient subsequently obtain a chest x-ray which showed low lung volumes severe kyphosis and some atelectasis  Patient was then sent to [place] for further evaluation  At this time her symptoms have completely resolved she has a cough that is in Frequent she has no shortness of breath no dyspnea on exertion  Patient she has no chest pain no palpitations 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  She walks approximately 3 miles per day without any She has no significant respiratory issues at this time  She has acute bronchitis 2 times per year She is allergic to grass She has wheezing episodes when she is around cats  She is scheduled for allergy testing today   She denies tobacco abuse however has had secondhand smoke exposure for approximately 20 years  Office spirometry shows no evidence of obstructive or restrictive lung disease Chest x-ray reviewed with patient CT scan findings reviewed with patient      PAST MEDICAL HISTORY :   has a past medical history of BMI 40.0-44.9, adult ([place]), Fibroid, History of mammogram (12/07/2014; 12/09/15), History of Papanicolaou smear of cervix (11/25/12; 12/09/15), Joint pain, 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  has a past surgical history that includes Cryoablation (03/14/2007); Cystoscopy (10/31/2009); and Abdominal hysterectomy (10/31/2009). Prior to Admission medications   Medication Sig Start Date End Date Taking? Authorizing Provider  Cholecalciferol (VITAMIN D3) 1000 units CAPS Take by mouth.   Yes [provider]  Citalopram Hydrobromide (CELEXA PO) Celexa   Yes [provider]  Glucosamine-Chondroitin 250-200 MG TABS Take by mouth.   Yes [provider]  KRILL OIL/ASTAXANTHIN 1000 MG CAPS Take by mouth.   Yes [provider]  meloxicam (MOBIC) 15 MG tablet Take 15 mg by mouth daily. 12/03/17  Yes [provider]  Misc Natural Products (IN-FLA-MEND) CAPS Take by mouth.   Yes [provider]  Multiple Vitamins-Minerals (QC WOMENS DAILY MULTIVITAMIN) TABS Take by mouth.   Yes [provider]  omeprazole (PRILOSEC) 20 MG capsule Take by mouth daily as needed.  04/14/17 04/14/18 Yes [provider]  Pyridoxine HCl (VITAMIN B-6) 250 MG tablet Take 250 mg by mouth daily.   Yes [provider]  SODIUM & POTASSIUM BICARBONATE PO Take by mouth.   Yes [provider]  vitamin B-12 (CYANOCOBALAMIN) 1000 MCG tablet Take 1,000 mcg by mouth daily.   Yes [provider]   Allergies  Allergen Reactions  . Augmentin [Amoxicillin-Pot Clavulanate] Other (See Comments)  . Levofloxacin Other (See Comments)    Joint pain    FAMILY HISTORY:  family history includes Cancer in her father. SOCIAL HISTORY:  reports that she has never smoked. She has never used  smokeless tobacco. She reports that she does not drink alcohol or use drugs.  REVIEW OF SYSTEMS:   Constitutional: Negative for fever, chills, weight loss, malaise/fatigue and diaphoresis.  HENT: Negative for hearing loss, ear  pain, nosebleeds, congestion, sore throat, neck pain, tinnitus and ear discharge.   Eyes: Negative for blurred vision, double vision, photophobia, pain, discharge and redness.  Respiratory: Negative for cough, hemoptysis, sputum production, shortness of breath, wheezing and stridor.   Cardiovascular: Negative for chest pain, palpitations, orthopnea, claudication, leg swelling and PND.  Gastrointestinal: Negative for heartburn, nausea, vomiting, abdominal pain, diarrhea, constipation, blood in stool and melena.  Genitourinary: Negative for dysuria, urgency, frequency, hematuria and flank pain.  Musculoskeletal: Negative for myalgias, back pain, joint pain and falls.  Skin: Negative for itching and rash.  Neurological: Negative for dizziness, tingling, tremors, sensory change, speech change, focal weakness, seizures, loss of consciousness, weakness and headaches.  Endo/Heme/Allergies: Negative for environmental allergies and polydipsia. Does not bruise/bleed easily.  ALL OTHER ROS ARE NEGATIVE    BP 124/82 (BP Location: Left Arm, Cuff Size: Large)   Pulse 66   Resp 16   Ht 5\' 3"  (1.6 m)   Wt 234 lb (106.1 kg)   LMP 11/20/2016   SpO2 96%   BMI 41.45 kg/m    Physical Examination:   GENERAL:NAD, no fevers, chills, no weakness no fatigue HEAD: Normocephalic, atraumatic.  EYES: Pupils equal, round, reactive to light. Extraocular muscles intact. No scleral icterus.  MOUTH: Moist mucosal membrane.   EAR, NOSE, THROAT: Clear without exudates. No external lesions.  NECK: Supple. No thyromegaly. No nodules. No JVD.  PULMONARY:CTA B/L no wheezes, no crackles, no rhonchi CARDIOVASCULAR: S1 and S2. Regular rate and rhythm. No murmurs, rubs, or gallops. No edema.  GASTROINTESTINAL: Soft, nontender, nondistended. No masses. Positive bowel sounds.  MUSCULOSKELETAL: No swelling, clubbing, or edema. Range of motion full in all extremities.  NEUROLOGIC: Cranial nerves II through XII are intact. No  gross focal neurological deficits.  SKIN: No ulceration, lesions, rashes, or cyanosis. Skin warm and dry. Turgor intact.  PSYCHIATRIC: Mood, affect within normal limits. The patient is awake, alert and oriented x 3. Insight, judgment intact.      ASSESSMENT / PLAN: 52 year old pleasant white female seen today for abnormal chest x-ray finding in the setting of upper respiratory tract infection with history of acute bronchitis  At this time patient has no residual symptoms of URI Patient with normal office spirometry and normal lung exam   At this time I believe that she has subtle atelectasis from her kyphosis and her obesity however this does not impede her daily activities as she is very active at school and with her job  She does however needs to lose a significant amount of weight she currently weighs 234 pounds  I have advised for her to use albuterol as needed for her bronchitis Patient is to get allergy testing at ENT today  I have discussed obtaining CT chest but at this time she has no symptoms and we can follow along with the chest radiographs if needed   No other recommendations at this time follow-up as needed   Patient very  satisfied with Plan of action and management. All questions answered  Corrin Parker, M.D.  [facility]  Medical Director [place] Director [facility] Cardio-Pulmonary Department

## 2018-04-05 NOTE — Patient Instructions (Signed)
ALBUTEROL AS NEEDED

## 2018-05-06 ENCOUNTER — Encounter: Payer: Self-pay | Admitting: *Deleted

## 2018-05-09 ENCOUNTER — Other Ambulatory Visit: Payer: Self-pay

## 2018-05-09 ENCOUNTER — Ambulatory Visit
Admission: RE | Admit: 2018-05-09 | Discharge: 2018-05-09 | Disposition: A | Discharge status: Home / Self Care | Payer: BC Managed Care – PPO | Attending: Gastroenterology | Admitting: Gastroenterology

## 2018-05-09 ENCOUNTER — Encounter
Admission: RE | Disposition: A | Discharge status: Home / Self Care | Payer: Self-pay | Source: Home / Self Care | Attending: Gastroenterology

## 2018-05-09 ENCOUNTER — Ambulatory Visit: Payer: BC Managed Care – PPO | Admitting: Anesthesiology

## 2018-05-09 ENCOUNTER — Encounter: Payer: Self-pay | Admitting: *Deleted

## 2018-05-09 DIAGNOSIS — Z6841 Body Mass Index (BMI) 40.0 and over, adult: Secondary | ICD-10-CM | POA: Diagnosis not present

## 2018-05-09 DIAGNOSIS — Z881 Allergy status to other antibiotic agents status: Secondary | ICD-10-CM | POA: Diagnosis not present

## 2018-05-09 DIAGNOSIS — Z791 Long term (current) use of non-steroidal anti-inflammatories (NSAID): Secondary | ICD-10-CM | POA: Diagnosis not present

## 2018-05-09 DIAGNOSIS — F329 Major depressive disorder, single episode, unspecified: Secondary | ICD-10-CM | POA: Diagnosis not present

## 2018-05-09 DIAGNOSIS — Z1211 Encounter for screening for malignant neoplasm of colon: Secondary | ICD-10-CM | POA: Diagnosis not present

## 2018-05-09 DIAGNOSIS — K5730 Diverticulosis of large intestine without perforation or abscess without bleeding: Secondary | ICD-10-CM | POA: Diagnosis not present

## 2018-05-09 DIAGNOSIS — Z79899 Other long term (current) drug therapy: Secondary | ICD-10-CM | POA: Insufficient documentation

## 2018-05-09 DIAGNOSIS — E559 Vitamin D deficiency, unspecified: Secondary | ICD-10-CM | POA: Insufficient documentation

## 2018-05-09 DIAGNOSIS — J45909 Unspecified asthma, uncomplicated: Secondary | ICD-10-CM | POA: Insufficient documentation

## 2018-05-09 HISTORY — PX: COLONOSCOPY WITH PROPOFOL: SHX5780

## 2018-05-09 SURGERY — COLONOSCOPY WITH PROPOFOL
Anesthesia: General

## 2018-05-09 MED ORDER — PROPOFOL 500 MG/50ML IV EMUL
INTRAVENOUS | Status: AC
  Filled 2018-05-09: qty 50

## 2018-05-09 MED ORDER — SODIUM CHLORIDE 0.9 % IV SOLN
INTRAVENOUS | Status: DC
  Administered 2018-05-09: 10:00:00 via INTRAVENOUS

## 2018-05-09 MED ORDER — PROPOFOL 10 MG/ML IV BOLUS
INTRAVENOUS | Status: DC | PRN
  Administered 2018-05-09: 50 mg via INTRAVENOUS
  Administered 2018-05-09: 100 mg via INTRAVENOUS

## 2018-05-09 MED ORDER — LIDOCAINE 2% (20 MG/ML) 5 ML SYRINGE
INTRAMUSCULAR | Status: DC | PRN
  Administered 2018-05-09: 50 mg via INTRAVENOUS

## 2018-05-09 MED ORDER — LIDOCAINE HCL (PF) 1 % IJ SOLN
INTRAMUSCULAR | Status: AC
  Administered 2018-05-09: 0.5 mL
  Filled 2018-05-09: qty 2

## 2018-05-09 NOTE — Transfer of Care (Signed)
Immediate Anesthesia Transfer of Care Note  Patient: Sarah Lambert  Procedure(s) Performed: COLONOSCOPY WITH PROPOFOL (N/A )  Patient Location: Endoscopy Unit  Anesthesia Type:General  Level of Consciousness: awake  Airway & Oxygen Therapy: Patient Spontanous Breathing  Post-op Assessment: Post -op Vital signs reviewed and stable  Post vital signs: stable  Last Vitals:  Vitals Value Taken Time  BP 102/69 05/09/2018 10:48 AM  Temp 36.1 C 05/09/2018 10:48 AM  Pulse 73 05/09/2018 10:52 AM  Resp 20 05/09/2018 10:52 AM  SpO2 98 % 05/09/2018 10:52 AM  Vitals shown include unvalidated device data.  Last Pain:  Vitals:   05/09/18 1048  TempSrc: Tympanic  PainSc: 0-No pain         Complications: No apparent anesthesia complications

## 2018-05-09 NOTE — Anesthesia Preprocedure Evaluation (Signed)
Anesthesia Evaluation  Patient identified by MRN, date of birth, ID band Patient awake    Reviewed: Allergy & Precautions, H&P , NPO status , Patient's Chart, lab work & pertinent test results, reviewed documented beta blocker date and time   History of Anesthesia Complications Negative for: history of anesthetic complications  Airway Mallampati: II  TM Distance: >3 FB Neck ROM: full    Dental  (+) Missing, Dental Advidsory Given, Teeth Intact   Pulmonary neg shortness of breath, asthma (seasonal, in spring and fall) , neg COPD, neg recent URI,           Cardiovascular Exercise Tolerance: Good negative cardio ROS       Neuro/Psych PSYCHIATRIC DISORDERS Depression negative neurological ROS     GI/Hepatic negative GI ROS, Neg liver ROS,   Endo/Other  neg diabetesMorbid obesity  Renal/GU negative Renal ROS  negative genitourinary   Musculoskeletal   Abdominal   Peds  Hematology negative hematology ROS (+)   Anesthesia Other Findings Past Medical History: No date: BMI 40.0-44.9, adult ([place]) No date: Fibroid 12/07/2014; 12/09/15: History of mammogram     Comment:  CAT I; ADD VIEWS - BENIGN 11/25/12; 12/09/15: History of Papanicolaou smear of cervix     Comment:  -/-; -/- No date: Joint pain 11/2011: Lipid metabolism disorder     Comment:  reck 2014 No date: Menorrhagia 06/2013: PMDD (premenstrual dysphoric disorder)     Comment:  STARTED ON ZOLOFT No date: Stress 11/2011: Vitamin D deficiency   Reproductive/Obstetrics negative OB ROS                             Anesthesia Physical Anesthesia Plan  ASA: III  Anesthesia Plan: General   Post-op Pain Management:    Induction: Intravenous  PONV Risk Score and Plan: 3 and Propofol infusion and TIVA  Airway Management Planned: Natural Airway and Nasal Cannula  Additional Equipment:   Intra-op Plan:   Post-operative Plan:    Informed Consent: I have reviewed the patients History and Physical, chart, labs and discussed the procedure including the risks, benefits and alternatives for the proposed anesthesia with the patient or authorized representative who has indicated his/her understanding and acceptance.   Dental Advisory Given  Plan Discussed with: Anesthesiologist, CRNA and Surgeon  Anesthesia Plan Comments:         Anesthesia Quick Evaluation

## 2018-05-09 NOTE — Anesthesia Post-op Follow-up Note (Signed)
Anesthesia QCDR form completed.        

## 2018-05-09 NOTE — Anesthesia Postprocedure Evaluation (Signed)
Anesthesia Post Note  Patient: Sarah Lambert  Procedure(s) Performed: COLONOSCOPY WITH PROPOFOL (N/A )  Patient location during evaluation: Endoscopy Anesthesia Type: General Level of consciousness: awake and alert Pain management: pain level controlled Vital Signs Assessment: post-procedure vital signs reviewed and stable Respiratory status: spontaneous breathing, nonlabored ventilation, respiratory function stable and patient connected to nasal cannula oxygen Cardiovascular status: blood pressure returned to baseline and stable Postop Assessment: no apparent nausea or vomiting Anesthetic complications: no     Last Vitals:  Vitals:   05/09/18 1108 05/09/18 1118  BP: (!) 124/58 126/68  Pulse: 69 62  Resp: 16 (!) 21  Temp:    SpO2: 100% 100%    Last Pain:  Vitals:   05/09/18 1118  TempSrc:   PainSc: 0-No pain                 Martha Clan

## 2018-05-09 NOTE — Op Note (Signed)
[facility] Patient Name: Sarah Lambert Procedure Date: 05/09/2018 10:20 AM MRN: [id number] Account #: [id number] Date of Birth: June 04, 1965 Admit Type: Outpatient Age: 52 Room: [facility] ENDO ROOM 4 Gender: Female Note Status: Finalized Procedure:            Colonoscopy Indications:          Screening for colorectal malignant neoplasm Providers:            Varnita B. Bonna Gains MD, MD Referring MD:         Forest Gleason Md, MD (Referring MD) Medicines:            Monitored Anesthesia Care Complications:        No immediate complications. Procedure:            Pre-Anesthesia Assessment:                       - Prior to the procedure, a History and Physical was                        performed, and patient medications, allergies and                        sensitivities were reviewed. The patient's tolerance of                        previous anesthesia was reviewed.                       - The risks and benefits of the procedure and the                        sedation options and risks were discussed with the                        patient. All questions were answered and informed                        consent was obtained.                       - Patient identification and proposed procedure were                        verified prior to the procedure by the physician, the                        nurse, the anesthetist and the technician. The                        procedure was verified in the pre-procedure area in the                        procedure room in the endoscopy suite.                       - ASA Grade Assessment: II - A patient with mild                        systemic disease.                       -  After reviewing the risks and benefits, the patient                        was deemed in satisfactory condition to undergo the                        procedure.                       After obtaining informed consent, the colonoscope was                 passed under direct vision. Throughout the procedure,                        the patient's blood pressure, pulse, and oxygen                        saturations were monitored continuously. The                        Colonoscope was introduced through the anus and                        advanced to the the cecum, identified by appendiceal                        orifice and ileocecal valve. The colonoscopy was                        performed with ease. The patient tolerated the                        procedure well. The quality of the bowel preparation                        was good. Findings:      The perianal and digital rectal examinations were normal.      A single diverticulum was found in the ascending colon.      The exam was otherwise without abnormality.      The rectum, sigmoid colon, descending colon, transverse colon, ascending       colon and cecum appeared normal.      The retroflexed view of the distal rectum and anal verge was normal and       showed no anal or rectal abnormalities. Impression:           - Diverticulosis in the ascending colon.                       - The examination was otherwise normal.                       - The rectum, sigmoid colon, descending colon,                        transverse colon, ascending colon and cecum are normal.                       - The distal rectum and anal verge are normal on  retroflexion view.                       - No specimens collected. Recommendation:       - Discharge patient to home.                       - Resume previous diet.                       - Continue present medications.                       - Repeat colonoscopy in 10 years for screening purposes.                       - Return to primary care physician as previously                        scheduled.                       - The findings and recommendations were discussed with                        the patient.                        - The findings and recommendations were discussed with                        the patient's family.                       - High fiber diet.                       - In the future, if patient develops new symptoms such                        as blood per rectum, abdominal pain, weight loss,                        altered bowel habits or any other reason for concern,                        patient should discuss this with her PCP as she may                        need a GI referral at that time or evaluation for need                        for colonoscopy earlier than her recommended screening                        colonoscopy.                       In addition, if patient's family history of colon                        cancer changes (no family history at this time) in the  future, earlier screening may be indicated and patient                        should discuss this with PCP as well. Procedure Code(s):    --- Professional ---                       L0786, Colorectal cancer screening; colonoscopy on                        individual not meeting criteria for high risk Diagnosis Code(s):    --- Professional ---                       Z12.11, Encounter for screening for malignant neoplasm                        of colon                       K57.30, Diverticulosis of large intestine without                        perforation or abscess without bleeding CPT copyright 2018 American Medical Association. All rights reserved. The codes documented in this report are preliminary and upon coder review may  be revised to meet current compliance requirements.  Vonda Antigua, MD Margretta Sidle B. Bonna Gains MD, MD 05/09/2018 10:46:56 AM This report has been signed electronically. Number of Addenda: 0 Note Initiated On: 05/09/2018 10:20 AM Scope Withdrawal Time: 0 hours 9 minutes 52 seconds  Total Procedure Duration: 0 hours 14 minutes 38 seconds       [facility]

## 2018-05-09 NOTE — H&P (Signed)
Vonda Antigua, MD [address] [place], [place], [place], [address] [address], [place], [place], [place], [address] Phone: [phone]  Fax: [phone]  Primary Care Physician:  [name], PA-C   Pre-Procedure History & Physical: HPI:  Sarah Lambert is a 52 y.o. female is here for a colonoscopy.   Past Medical History:  Diagnosis Date  . BMI 40.0-44.9, adult ([place])   . Fibroid   . History of mammogram 12/07/2014; 12/09/15   CAT I; ADD VIEWS - BENIGN  . History of Papanicolaou smear of cervix 11/25/12; 12/09/15   -/-; -/-  . Joint pain   . 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 Menorrhagia   . PMDD (premenstrual dysphoric disorder) 06/2013   STARTED ON ZOLOFT  . Stress   . Vitamin D deficiency 11/2011    Past Surgical History:  Procedure Laterality Date  . ABDOMINAL HYSTERECTOMY  10/31/2009   TAH, SCHED AS LSH/CONVERTED TO TAH UTERIS W/O CERVIX 2ND TO FIBROIDS AND ENLARGED UTERUS  . CRYOABLATION  03/14/2007   HER OPTIONS FOR MENORRHAGIA  . CYSTOSCOPY  10/31/2009   AFTER TAH TO ENSURE URETERAL PATENCY    Prior to Admission medications   Medication Sig Start Date End Date Taking? Authorizing Provider  albuterol (PROVENTIL HFA;VENTOLIN HFA) 108 (90 Base) MCG/ACT inhaler Inhale 2 puffs into the lungs every 6 (six) hours as needed for wheezing or shortness of breath. 04/05/18  Yes Flora Lipps, MD  Cholecalciferol (VITAMIN D3) 1000 units CAPS Take by mouth.   Yes [provider]  Citalopram Hydrobromide (CELEXA PO) Celexa   Yes [provider]  Glucosamine-Chondroitin 250-200 MG TABS Take by mouth.   Yes [provider]  KRILL OIL/ASTAXANTHIN 1000 MG CAPS Take by mouth.   Yes [provider]  meloxicam (MOBIC) 15 MG tablet Take 15 mg by mouth daily. 12/03/17  Yes [provider]  Misc Natural Products (IN-FLA-MEND) CAPS Take by mouth.   Yes [provider]  Multiple Vitamins-Minerals (QC  WOMENS DAILY MULTIVITAMIN) TABS Take by mouth.   Yes [provider]  Pyridoxine HCl (VITAMIN B-6) 250 MG tablet Take 250 mg by mouth daily.   Yes [provider]  SODIUM & POTASSIUM BICARBONATE PO Take by mouth.   Yes [provider]  vitamin B-12 (CYANOCOBALAMIN) 1000 MCG tablet Take 1,000 mcg by mouth daily.   Yes [provider]  omeprazole (PRILOSEC) 20 MG capsule Take by mouth daily as needed.  04/14/17 04/14/18  [provider]    Allergies as of 12/23/2017 - Review Complete 12/22/2017  Allergen Reaction Noted  . Augmentin [amoxicillin-pot clavulanate] Other (See Comments) 02/02/2014  . Levofloxacin Other (See Comments) 02/23/2017    Family History  Problem Relation Age of Onset  . Cancer Father        [facility]    Social History   Socioeconomic History  . Marital status: Married    Spouse name: Not on file  . Number of children: 3  . Years of education: 50  . Highest education level: Not on file  Occupational History  . Occupation: TEACHER  Social Needs  . Financial resource strain: Not on file  . Food insecurity:    Worry: Not on file    Inability: Not on file  . Transportation needs:    Medical: Not on file    Non-medical: Not on file  Tobacco Use  . Smoking status: Never Smoker  . Smokeless tobacco: Never Used  Substance  and Sexual Activity  . Alcohol use: No  . Drug use: No  . Sexual activity: Yes    Birth control/protection: None, Surgical    Comment: Hysterectomy  Lifestyle  . Physical activity:    Days per week: Not on file    Minutes per session: Not on file  . Stress: Not on file  Relationships  . Social connections:    Talks on phone: Not on file    Gets together: Not on file    Attends religious service: Not on file    Active member of club or organization: Not on file    Attends meetings of clubs or organizations: Not on file    Relationship status: Not on file  . Intimate partner  violence:    Fear of current or ex partner: Not on file    Emotionally abused: Not on file    Physically abused: Not on file    Forced sexual activity: Not on file  Other Topics Concern  . Not on file  Social History Narrative  . Not on file    Review of Systems: See HPI, otherwise negative ROS  Physical Exam: BP 137/71   Pulse 85   Temp (!) 97.5 F (36.4 C) (Tympanic)   Resp 18   Ht 5\' 3"  (1.6 m)   Wt 106.6 kg   LMP 11/20/2016   BMI 41.63 kg/m  General:   Alert,  pleasant and cooperative in NAD Head:  Normocephalic and atraumatic. Neck:  Supple; no masses or thyromegaly. Lungs:  Clear throughout to auscultation, normal respiratory effort.    Heart:  +S1, +S2, Regular rate and rhythm, No edema. Abdomen:  Soft, nontender and nondistended. Normal bowel sounds, without guarding, and without rebound.   Neurologic:  Alert and  oriented x4;  grossly normal neurologically.  Impression/Plan: Sarah Lambert is here for a colonoscopy to be performed for average risk screening.  Risks, benefits, limitations, and alternatives regarding  colonoscopy have been reviewed with the patient.  Questions have been answered.  All parties agreeable.   Virgel Manifold, MD  05/09/2018, 10:07 AM

## 2018-05-10 ENCOUNTER — Encounter: Payer: Self-pay | Admitting: Gastroenterology

## 2018-05-19 DIAGNOSIS — K5790 Diverticulosis of intestine, part unspecified, without perforation or abscess without bleeding: Secondary | ICD-10-CM | POA: Insufficient documentation

## 2018-06-28 ENCOUNTER — Ambulatory Visit: Payer: BC Managed Care – PPO | Admitting: Podiatry

## 2018-06-28 ENCOUNTER — Encounter: Payer: Self-pay | Admitting: Podiatry

## 2018-06-28 ENCOUNTER — Ambulatory Visit (INDEPENDENT_AMBULATORY_CARE_PROVIDER_SITE_OTHER): Payer: BC Managed Care – PPO

## 2018-06-28 ENCOUNTER — Other Ambulatory Visit: Payer: Self-pay | Admitting: Podiatry

## 2018-06-28 DIAGNOSIS — M2142 Flat foot [pes planus] (acquired), left foot: Secondary | ICD-10-CM | POA: Diagnosis not present

## 2018-06-28 DIAGNOSIS — M722 Plantar fascial fibromatosis: Secondary | ICD-10-CM

## 2018-06-28 DIAGNOSIS — Q6650 Congenital pes planus, unspecified foot: Secondary | ICD-10-CM

## 2018-06-28 MED ORDER — MELOXICAM 15 MG PO TABS: 15.0000 mg | ORAL_TABLET | Freq: Every day | ORAL | 1 refills | Status: AC

## 2018-06-29 ENCOUNTER — Other Ambulatory Visit: Payer: BC Managed Care – PPO | Admitting: Orthotics

## 2018-07-01 NOTE — Progress Notes (Signed)
   Subjective:  53 year old female presenting today as a new patient with a chief complaint of constant aching pain of the bilateral feet that began several years ago. She states the right foot hurts more than the left and the pain is located in the arches. Walking increases the pain. She has used orthotics and inserts in the past as well as taking Meloxicam. Patient is here for further evaluation and treatment.   Past Medical History:  Diagnosis Date  . BMI 40.0-44.9, adult ([place])   . Fibroid   . History of mammogram 12/07/2014; 12/09/15   CAT I; ADD VIEWS - BENIGN  . History of Papanicolaou smear of cervix 11/25/12; 12/09/15   -/-; -/-  . Joint pain   . 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 Menorrhagia   . PMDD (premenstrual dysphoric disorder) 06/2013   STARTED ON ZOLOFT  . Stress   . Vitamin D deficiency 11/2011       Objective/Physical Exam General: The patient is alert and oriented x3 in no acute distress.  Dermatology: Skin is warm, dry and supple bilateral lower extremities. Negative for open lesions or macerations.  Vascular: Palpable pedal pulses bilaterally. No edema or erythema noted. Capillary refill within normal limits.  Neurological: Epicritic and protective threshold grossly intact bilaterally.   Musculoskeletal Exam: Range of motion within normal limits to all pedal and ankle joints bilateral. Muscle strength 5/5 in all groups bilateral.  Upon weightbearing there is a medial longitudinal arch collapse bilaterally. Remove foot valgus noted to the bilateral lower extremities with excessive pronation upon mid stance. Tenderness to palpation to the plantar aspect of the bilateral heels along the plantar fascia.   Radiographic Exam:  Normal osseous mineralization. Joint spaces preserved. No fracture/dislocation/boney destruction.   Pes planus noted on radiographic exam lateral views. Decreased calcaneal inclination and metatarsal declination angle is noted.  Anterior break in the cyma line noted on lateral views. Medial talar head to deviation noted on AP radiograph.   Assessment: 1. pes planus bilateral 2. Plantar fasciitis bilateral    Plan of Care:  1. Patient was evaluated. X-Rays reviewed.  2. Patient already fitted for custom orthotics by Liliane Channel, Pedorthist.  3. Continue wearing good shoe gear.  4. Prescription for Meloxicam provided to patient. 5. Return to clinic as needed.   School Pharmacist, hospital.    Edrick Kins, DPM [facility]  Dr. Edrick Kins, [address]                                        [place], [place] [address]                Office [phone]  Fax [phone]

## 2018-07-18 ENCOUNTER — Telehealth: Payer: Self-pay | Admitting: Podiatry

## 2018-07-18 NOTE — Telephone Encounter (Signed)
Pt left a message stating someone had called her to r/s her appt for the orthotics.She will need a early morning appt. Please call pt.

## 2018-07-20 ENCOUNTER — Other Ambulatory Visit: Payer: BC Managed Care – PPO | Admitting: Orthotics

## 2018-08-03 ENCOUNTER — Other Ambulatory Visit: Payer: BC Managed Care – PPO | Admitting: Orthotics

## 2018-08-10 ENCOUNTER — Ambulatory Visit: Payer: BC Managed Care – PPO | Admitting: Orthotics

## 2018-08-10 ENCOUNTER — Other Ambulatory Visit: Payer: Self-pay

## 2018-08-10 DIAGNOSIS — M722 Plantar fascial fibromatosis: Secondary | ICD-10-CM

## 2018-08-10 DIAGNOSIS — Q6650 Congenital pes planus, unspecified foot: Secondary | ICD-10-CM

## 2018-08-10 NOTE — Progress Notes (Signed)
Patient came in today to pick up custom made foot orthotics.  The goals were accomplished and the patient reported no dissatisfaction with said orthotics.  Patient was advised of breakin period and how to report any issues. 

## 2018-11-09 HISTORY — PX: REPLACEMENT TOTAL KNEE: SUR1224

## 2018-12-26 ENCOUNTER — Telehealth: Payer: Self-pay

## 2018-12-26 NOTE — Telephone Encounter (Signed)
Called pt back in regards to orthotics problem, possible adjusted needed due to foot sliding off to the side in shoes; Left VM to call and schedule appt with Liliane Channel so he can check orthotics for her.

## 2019-01-11 ENCOUNTER — Ambulatory Visit: Payer: BC Managed Care – PPO | Admitting: Orthotics

## 2019-01-11 ENCOUNTER — Other Ambulatory Visit: Payer: Self-pay

## 2019-01-11 DIAGNOSIS — Q6650 Congenital pes planus, unspecified foot: Secondary | ICD-10-CM

## 2019-01-11 DIAGNOSIS — M722 Plantar fascial fibromatosis: Secondary | ICD-10-CM

## 2019-01-12 NOTE — Progress Notes (Signed)
Added valgus wedging to keep foot from rolling out.

## 2019-01-24 ENCOUNTER — Other Ambulatory Visit: Payer: Self-pay

## 2019-01-24 ENCOUNTER — Encounter: Payer: Self-pay | Admitting: Obstetrics and Gynecology

## 2019-01-24 ENCOUNTER — Ambulatory Visit (INDEPENDENT_AMBULATORY_CARE_PROVIDER_SITE_OTHER): Payer: BC Managed Care – PPO | Admitting: Obstetrics and Gynecology

## 2019-01-24 VITALS — BP 110/76 | Ht 62.0 in | Wt 247.0 lb

## 2019-01-24 DIAGNOSIS — Z01419 Encounter for gynecological examination (general) (routine) without abnormal findings: Secondary | ICD-10-CM

## 2019-01-24 DIAGNOSIS — Z1239 Encounter for other screening for malignant neoplasm of breast: Secondary | ICD-10-CM

## 2019-01-24 NOTE — Progress Notes (Signed)
Chief Complaint  Patient presents with  . Gynecologic Exam    HPI:      [name] is a 53 y.o. [phone] who LMP was Patient's last menstrual period was 11/20/2016., presents today for her annual examination.  Her menses are absent due to menopause/hyst. She is s/p supracx hyst due to [facility] but had continued to have monthly, light spotting after procedure. Dysmenorrhea none. She does not have PMB.  She does not have vasomotor sx.   Sex activity: single partner, contraception - status post hysterectomy. She does not have vaginal dryness.  Last Pap: December 09, 2015  Results were: no abnormalities /neg HPV DNA.  Hx of STDs: none  Last mammogram: 12/28/17 at [facility] Results were: normal--routine follow-up in 12 months There is no FH of breast cancer. There is no FH of ovarian cancer. The patient does do self-breast exams.  Colonoscopy: 04/2018 with normal results; repeat due after 10 yrs  Tobacco use: The patient denies current or previous tobacco use. Alcohol use: none  No drug use Exercise: min active; s/p knee replacement and now getting back to exercise  She does get adequate calcium and Vitamin D in her diet. Normal fasting labs last yr except mildly elevated TGs. PCP doing labs now.   Past Medical History:  Diagnosis Date  . BMI 40.0-44.9, adult ([place])   . Fibroid   . History of mammogram 12/07/2014; 12/09/15   CAT I; ADD VIEWS - BENIGN  . History of Papanicolaou smear of cervix 11/25/12; 12/09/15   -/-; -/-  . Joint pain   . 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 Menorrhagia   . PMDD (premenstrual dysphoric disorder) 06/2013   STARTED ON ZOLOFT  . Stress   . Vitamin D deficiency 11/2011    Past Surgical History:  Procedure Laterality Date  . ABDOMINAL HYSTERECTOMY  10/31/2009   TAH, SCHED AS LSH/CONVERTED TO TAH UTERIS W/O CERVIX 2ND TO FIBROIDS AND ENLARGED UTERUS  . COLONOSCOPY WITH PROPOFOL N/A 05/09/2018   Procedure: COLONOSCOPY WITH PROPOFOL;   Surgeon: Virgel Manifold, MD;  Location: [facility] ENDOSCOPY;  Service: Endoscopy;  Laterality: N/A;  . CRYOABLATION  03/14/2007   HER OPTIONS FOR MENORRHAGIA  . CYSTOSCOPY  10/31/2009   AFTER TAH TO ENSURE URETERAL PATENCY  . 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 Cancer Father        [facility]    Social History   Socioeconomic History  . Marital status: Married    Spouse name: Not on file  . Number of children: 3  . Years of education: 45  . Highest education level: Not on file  Occupational History  . Occupation: TEACHER  Social Needs  . Financial resource strain: Not on file  . Food insecurity    Worry: Not on file    Inability: Not on file  . Transportation needs    Medical: Not on file    Non-medical: Not on file  Tobacco Use  . Smoking status: Never Smoker  . Smokeless tobacco: Never Used  Substance and Sexual Activity  . Alcohol use: No  . Drug use: No  . Sexual activity: Yes    Birth control/protection: None, Surgical    Comment: Hysterectomy  Lifestyle  . Physical activity    Days per week: Not on file    Minutes per session: Not on file  . Stress: Not on file  Relationships  .  Social Herbalist on phone: Not on file    Gets together: Not on file    Attends religious service: Not on file    Active member of club or organization: Not on file    Attends meetings of clubs or organizations: Not on file    Relationship status: Not on file  . Intimate partner violence    Fear of current or ex partner: Not on file    Emotionally abused: Not on file    Physically abused: Not on file    Forced sexual activity: Not on file  Other Topics Concern  . Not on file  Social History Narrative  . Not on file     Current Outpatient Medications:  .  albuterol (PROVENTIL HFA;VENTOLIN HFA) 108 (90 Base) MCG/ACT inhaler, Inhale 2 puffs into the lungs every 6 (six) hours as needed for wheezing or  shortness of breath., Disp: 1 Inhaler, Rfl: 2 .  busPIRone (BUSPAR) 7.5 MG tablet, Take by mouth., Disp: , Rfl:  .  Cholecalciferol (VITAMIN D3) 1000 units CAPS, Take by mouth., Disp: , Rfl:  .  Citalopram Hydrobromide (CELEXA PO), Celexa, Disp: , Rfl:  .  cyclobenzaprine (FLEXERIL) 10 MG tablet, cyclobenzaprine 10 mg tablet, Disp: , Rfl:  .  FLOVENT HFA 110 MCG/ACT inhaler, Inhale 2 puffs into the lungs 2 (two) times daily., Disp: , Rfl: 9 .  Glucosamine-Chondroitin 250-200 MG TABS, Take by mouth., Disp: , Rfl:  .  KRILL OIL/ASTAXANTHIN 1000 MG CAPS, Take by mouth., Disp: , Rfl:  .  meloxicam (MOBIC) 15 MG tablet, meloxicam 15 mg tablet, Disp: , Rfl:  .  Misc Natural Products (IN-FLA-MEND) CAPS, Take by mouth., Disp: , Rfl:  .  montelukast (SINGULAIR) 10 MG tablet, Take by mouth., Disp: , Rfl:  .  Multiple Vitamins-Minerals (QC WOMENS DAILY MULTIVITAMIN) TABS, Take by mouth., Disp: , Rfl:  .  Pyridoxine HCl (VITAMIN B-6) 250 MG tablet, Take 250 mg by mouth daily., Disp: , Rfl:  .  SODIUM & POTASSIUM BICARBONATE PO, Take by mouth., Disp: , Rfl:  .  tacrolimus (PROTOPIC) 0.1 % ointment, tacrolimus 0.1 % topical ointment, Disp: , Rfl:  .  tobramycin-dexamethasone (TOBRADEX) ophthalmic solution, tobramycin 0.3 %-dexamethasone 0.1 % eye drops,suspension, Disp: , Rfl:  .  traMADol (ULTRAM) 50 MG tablet, Take 50 mg by mouth every 6 (six) hours as needed. for pain, Disp: , Rfl: 0 .  traZODone (DESYREL) 100 MG tablet, Take by mouth., Disp: , Rfl:  .  triamcinolone cream (KENALOG) 0.1 %, triamcinolone acetonide 0.1 % topical cream, Disp: , Rfl:  .  vitamin B-12 (CYANOCOBALAMIN) 1000 MCG tablet, Take 1,000 mcg by mouth daily., Disp: , Rfl:  .  omeprazole (PRILOSEC) 20 MG capsule, Take by mouth daily as needed. , Disp: , Rfl:    ROS:  Review of Systems  Constitutional: Negative for fatigue, fever and unexpected weight change.  Respiratory: Negative for cough, shortness of breath and wheezing.    Cardiovascular: Negative for chest pain, palpitations and leg swelling.  Gastrointestinal: Negative for blood in stool, constipation, diarrhea, nausea and vomiting.  Endocrine: Negative for cold intolerance, heat intolerance and polyuria.  Genitourinary: Negative for dyspareunia, dysuria, flank pain, frequency, genital sores, hematuria, menstrual problem, pelvic pain, urgency, vaginal bleeding, vaginal discharge and vaginal pain.  Musculoskeletal: Negative for back pain, joint swelling and myalgias.  Skin: Negative for rash.  Neurological: Negative for dizziness, syncope, light-headedness, numbness and headaches.  Hematological: Negative for adenopathy.  Psychiatric/Behavioral: Negative  for agitation, confusion, sleep disturbance and suicidal ideas. The patient is not nervous/anxious.      Objective: BP 110/76   Ht 5\' 2"  (1.575 m)   Wt 247 lb (112 kg)   LMP 11/20/2016   BMI 45.18 kg/m    Physical Exam Constitutional:      Appearance: She is well-developed.  Genitourinary:     Vulva, vagina, cervix, right adnexa and left adnexa normal.     No vulval lesion or tenderness noted.     No vaginal discharge, erythema or tenderness.     No cervical polyp.     Uterus is absent.     No right or left adnexal mass present.     Right adnexa not tender.     Left adnexa not tender.     Genitourinary Comments: UTERUS  SURG REM  Neck:     Musculoskeletal: Normal range of motion.     Thyroid: No thyromegaly.  Cardiovascular:     Rate and Rhythm: Normal rate and regular rhythm.     Heart sounds: Normal heart sounds. No murmur.  Pulmonary:     Effort: Pulmonary effort is normal.     Breath sounds: Normal breath sounds.  Chest:     Breasts:        Right: No mass, nipple discharge, skin change or tenderness.        Left: No mass, nipple discharge, skin change or tenderness.  Abdominal:     Palpations: Abdomen is soft.     Tenderness: There is no abdominal tenderness. There is no guarding.   Musculoskeletal: Normal range of motion.  Neurological:     General: No focal deficit present.     Mental Status: She is alert and oriented to person, place, and time.     Cranial Nerves: No cranial nerve deficit.  Skin:    General: Skin is warm and dry.  Psychiatric:        Mood and Affect: Mood normal.        Behavior: Behavior normal.        Thought Content: Thought content normal.        Judgment: Judgment normal.  Vitals signs reviewed.     Assessment/Plan:  Encounter for annual routine gynecological examination  Screening for breast cancer - Plan: MM 3D SCREEN BREAST BILATERAL--pt to sched mammo          GYN counsel mammography screening, adequate intake of calcium and vitamin D    F/U  Return in about 1 year (around 01/24/2020).   B. , PA-C 01/25/2019 9:20 AM

## 2019-01-24 NOTE — Patient Instructions (Signed)
I value your feedback and entrusting us with your care. If you get a [facility] patient survey, I would appreciate you taking the time to let us know about your experience today. Thank you!    [facility]: [phone]  

## 2019-01-25 ENCOUNTER — Encounter: Payer: Self-pay | Admitting: Obstetrics and Gynecology

## 2019-01-25 ENCOUNTER — Ambulatory Visit: Payer: BC Managed Care – PPO | Admitting: Orthotics

## 2019-01-25 ENCOUNTER — Other Ambulatory Visit: Payer: Self-pay

## 2019-01-25 DIAGNOSIS — Q6650 Congenital pes planus, unspecified foot: Secondary | ICD-10-CM

## 2019-01-25 DIAGNOSIS — M722 Plantar fascial fibromatosis: Secondary | ICD-10-CM

## 2019-01-25 NOTE — Progress Notes (Signed)
Adjusted f/o to patients satiscation.

## 2019-01-30 ENCOUNTER — Telehealth: Payer: Self-pay | Admitting: Podiatry

## 2019-01-30 NOTE — Telephone Encounter (Signed)
Pt aware I am sending a pair of inserts to [place] for her to take when she returns her orthotics for adjustment.  Pt wants them built up a little.

## 2019-01-30 NOTE — Telephone Encounter (Signed)
Pt has orthotics and would like you to redo them but has no other inserts to go into her shoes when she drops the old ones off. She wears a size 9. She said she met with you last week and definitely wants the sides built up like you had discussed at that appt. She is wanting to drop them off this week. °

## 2019-02-03 ENCOUNTER — Encounter: Payer: Self-pay | Admitting: Obstetrics and Gynecology

## 2019-02-13 ENCOUNTER — Other Ambulatory Visit: Payer: Self-pay

## 2019-02-13 DIAGNOSIS — Z20822 Contact with and (suspected) exposure to covid-19: Secondary | ICD-10-CM

## 2019-02-13 DIAGNOSIS — Z20828 Contact with and (suspected) exposure to other viral communicable diseases: Secondary | ICD-10-CM

## 2019-02-15 LAB — NOVEL CORONAVIRUS, NAA: SARS-CoV-2, NAA: NOT DETECTED

## 2019-03-01 ENCOUNTER — Other Ambulatory Visit: Payer: Self-pay

## 2019-03-01 ENCOUNTER — Ambulatory Visit: Payer: BC Managed Care – PPO | Admitting: Orthotics

## 2019-03-01 DIAGNOSIS — M722 Plantar fascial fibromatosis: Secondary | ICD-10-CM

## 2019-03-01 NOTE — Progress Notes (Signed)
Refurbished f/o not in, coming in next week

## 2019-04-10 ENCOUNTER — Encounter: Payer: Self-pay | Admitting: Obstetrics and Gynecology

## 2019-06-28 ENCOUNTER — Encounter: Payer: Self-pay | Admitting: Obstetrics and Gynecology

## 2019-06-28 ENCOUNTER — Other Ambulatory Visit: Payer: Self-pay

## 2019-06-28 ENCOUNTER — Ambulatory Visit (INDEPENDENT_AMBULATORY_CARE_PROVIDER_SITE_OTHER): Payer: BC Managed Care – PPO | Admitting: Obstetrics and Gynecology

## 2019-06-28 VITALS — BP 100/80 | Ht 63.0 in | Wt 260.0 lb

## 2019-06-28 DIAGNOSIS — N3001 Acute cystitis with hematuria: Secondary | ICD-10-CM | POA: Diagnosis not present

## 2019-06-28 LAB — POCT URINALYSIS DIPSTICK
Bilirubin, UA: NEGATIVE
Glucose, UA: NEGATIVE
Ketones, UA: NEGATIVE
Nitrite, UA: POSITIVE
Protein, UA: NEGATIVE
Spec Grav, UA: 1.015 (ref 1.010–1.025)
pH, UA: 6 (ref 5.0–8.0)

## 2019-06-28 MED ORDER — NITROFURANTOIN MONOHYD MACRO 100 MG PO CAPS: 100.0000 mg | ORAL_CAPSULE | Freq: Two times a day (BID) | ORAL | 0 refills | Status: AC

## 2019-06-28 NOTE — Progress Notes (Signed)
[name], PA-C   Chief Complaint  Patient presents with  . Urinary Tract Infection    pressure and frequency urinating, back pain, no blood since this morning    HPI:      [name] is a 54 y.o. [phone] who LMP was Patient's last menstrual period was 11/20/2016., presents today for UTI sx of frequency, pelvic pressure, dysuria, urgency, LBP since this AM. No hematuria, no vag sx. No vag bleeding. S/p TAH. Hx of UTIs in distant past.  Past Surgical History:  Procedure Laterality Date  . ABDOMINAL HYSTERECTOMY  10/31/2009   TAH, SCHED AS LSH/CONVERTED TO TAH UTERIS W/O CERVIX 2ND TO FIBROIDS AND ENLARGED UTERUS  . COLONOSCOPY WITH PROPOFOL N/A 05/09/2018   Procedure: COLONOSCOPY WITH PROPOFOL;  Surgeon: Virgel Manifold, MD;  Location: [facility] ENDOSCOPY;  Service: Endoscopy;  Laterality: N/A;  . CRYOABLATION  03/14/2007   HER OPTIONS FOR MENORRHAGIA  . CYSTOSCOPY  10/31/2009   AFTER TAH TO ENSURE URETERAL PATENCY  . 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 Cancer Father        [facility]    Social History   Socioeconomic History  . Marital status: Married    Spouse name: Not on file  . Number of children: 3  . Years of education: 12  . Highest education level: Not on file  Occupational History  . Occupation: TEACHER  Tobacco Use  . Smoking status: Never Smoker  . Smokeless tobacco: Never Used  Substance and Sexual Activity  . Alcohol use: No  . Drug use: No  . Sexual activity: Yes    Birth control/protection: None, Surgical    Comment: Hysterectomy  Other Topics Concern  . Not on file  Social History Narrative  . Not on file   Social Determinants of Health   Financial Resource Strain:   . Difficulty of Paying Living Expenses: Not on file  Food Insecurity:   . Worried About Charity fundraiser in the Last Year: Not on file  . Ran Out of Food in the Last Year: Not on file    Transportation Needs:   . Lack of Transportation (Medical): Not on file  . Lack of Transportation (Non-Medical): Not on file  Physical Activity:   . Days of Exercise per Week: Not on file  . Minutes of Exercise per Session: Not on file  Stress:   . Feeling of Stress : Not on file  Social Connections:   . Frequency of Communication with Friends and Family: Not on file  . Frequency of Social Gatherings with Friends and Family: Not on file  . Attends Religious Services: Not on file  . Active Member of Clubs or Organizations: Not on file  . Attends Archivist Meetings: Not on file  . Marital Status: Not on file  Intimate Partner Violence:   . Fear of Current or Ex-Partner: Not on file  . Emotionally Abused: Not on file  . Physically Abused: Not on file  . Sexually Abused: Not on file    Outpatient Medications Prior to Visit  Medication Sig Dispense Refill  . albuterol (PROVENTIL HFA;VENTOLIN HFA) 108 (90 Base) MCG/ACT inhaler Inhale 2 puffs into the lungs every 6 (six) hours as needed for wheezing or shortness of breath. 1 Inhaler 2  . buPROPion (WELLBUTRIN XL) 150 MG 24 hr tablet Take by mouth.    . Cholecalciferol (VITAMIN D3) 1000 units  CAPS Take by mouth.    . citalopram (CELEXA) 20 MG tablet Take 20 mg by mouth daily.    . cyclobenzaprine (FLEXERIL) 10 MG tablet cyclobenzaprine 10 mg tablet    . FLOVENT HFA 110 MCG/ACT inhaler Inhale 2 puffs into the lungs 2 (two) times daily.  9  . Glucosamine-Chondroitin 250-200 MG TABS Take by mouth.    Marland Kitchen KRILL OIL/ASTAXANTHIN 1000 MG CAPS Take by mouth.    . meloxicam (MOBIC) 15 MG tablet meloxicam 15 mg tablet    . Misc Natural Products (IN-FLA-MEND) CAPS Take by mouth.    . montelukast (SINGULAIR) 10 MG tablet Take by mouth.    . Multiple Vitamins-Minerals (QC WOMENS DAILY MULTIVITAMIN) TABS Take by mouth.    . Pyridoxine HCl (VITAMIN B-6) 250 MG tablet Take 250 mg by mouth daily.    . SODIUM & POTASSIUM BICARBONATE PO Take by  mouth.    . tacrolimus (PROTOPIC) 0.1 % ointment tacrolimus 0.1 % topical ointment    . traZODone (DESYREL) 100 MG tablet Take by mouth.    . vitamin B-12 (CYANOCOBALAMIN) 1000 MCG tablet Take 1,000 mcg by mouth daily.    Marland Kitchen omeprazole (PRILOSEC) 20 MG capsule Take by mouth daily as needed.     . busPIRone (BUSPAR) 7.5 MG tablet Take by mouth.    . Citalopram Hydrobromide (CELEXA PO) Celexa    . tobramycin-dexamethasone (TOBRADEX) ophthalmic solution tobramycin 0.3 %-dexamethasone 0.1 % eye drops,suspension    . traMADol (ULTRAM) 50 MG tablet Take 50 mg by mouth every 6 (six) hours as needed. for pain  0  . triamcinolone cream (KENALOG) 0.1 % triamcinolone acetonide 0.1 % topical cream     No facility-administered medications prior to visit.      ROS:  Review of Systems  Constitutional: Negative for fever.  Gastrointestinal: Negative for blood in stool, constipation, diarrhea, nausea and vomiting.  Genitourinary: Positive for dysuria, frequency, pelvic pain and urgency. Negative for dyspareunia, flank pain, hematuria, vaginal bleeding, vaginal discharge and vaginal pain.  Musculoskeletal: Positive for back pain.  Skin: Negative for rash.    OBJECTIVE:   Vitals:  BP 100/80   Ht 5\' 3"  (1.6 m)   Wt 260 lb (117.9 kg)   LMP 11/20/2016   BMI 46.06 kg/m   Physical Exam Vitals reviewed.  Constitutional:      Appearance: She is well-developed. She is not ill-appearing or toxic-appearing.  Pulmonary:     Effort: Pulmonary effort is normal.  Abdominal:     Tenderness: There is no right CVA tenderness or left CVA tenderness.  Musculoskeletal:        General: Normal range of motion.     Cervical back: Normal range of motion.  Neurological:     General: No focal deficit present.     Mental Status: She is alert and oriented to person, place, and time.     Cranial Nerves: No cranial nerve deficit.  Psychiatric:        Behavior: Behavior normal.        Thought Content: Thought  content normal.        Judgment: Judgment normal.     Results: Results for orders placed or performed in visit on 06/28/19 (from the past 24 hour(s))  POCT Urinalysis Dipstick     Status: Abnormal   Collection Time: 06/28/19  4:51 PM  Result Value Ref Range   Color, UA yellow    Clarity, UA cloudy    Glucose, UA Negative Negative  Bilirubin, UA neg    Ketones, UA neg    Spec Grav, UA 1.015 1.010 - 1.025   Blood, UA large    pH, UA 6.0 5.0 - 8.0   Protein, UA Negative Negative   Urobilinogen, UA     Nitrite, UA pos    Leukocytes, UA Small (1+) (A) Negative   Appearance     Odor       Assessment/Plan: Acute cystitis with hematuria - Plan: POCT Urinalysis Dipstick, Urine Culture, nitrofurantoin, macrocrystal-monohydrate, (MACROBID) 100 MG capsule; Pos sx and UA. Rx macrobid. Chck C&S. F/u prn.    Meds ordered this encounter  Medications  . nitrofurantoin, macrocrystal-monohydrate, (MACROBID) 100 MG capsule    Sig: Take 1 capsule (100 mg total) by mouth 2 (two) times daily for 5 days.    Dispense:  10 capsule    Refill:  0    Order Specific Question:   Supervising Provider    Answer:   Gae Dry [id number]      Return if symptoms worsen or fail to improve.  Myanna Ziesmer B. Cher Franzoni, PA-C 06/28/2019 4:53 PM

## 2019-06-28 NOTE — Patient Instructions (Signed)
I value your feedback and entrusting us with your care. If you get a [facility] patient survey, I would appreciate you taking the time to let us know about your experience today. Thank you!  As of April 20, 2019, your lab results will be released to your MyChart immediately, before I even have a chance to see them. Please give me time to review them and contact you if there are any abnormalities. Thank you for your patience.  

## 2019-06-30 ENCOUNTER — Encounter: Payer: Self-pay | Admitting: Obstetrics and Gynecology

## 2019-06-30 ENCOUNTER — Telehealth: Payer: Self-pay

## 2019-06-30 ENCOUNTER — Other Ambulatory Visit: Payer: Self-pay | Admitting: Obstetrics and Gynecology

## 2019-06-30 MED ORDER — SULFAMETHOXAZOLE-TRIMETHOPRIM 800-160 MG PO TABS: 1.0000 | ORAL_TABLET | Freq: Two times a day (BID) | ORAL | 0 refills | Status: AC

## 2019-06-30 NOTE — Progress Notes (Signed)
Rx bactrim for UTI sx. Rash with macrobid. Updated in allergies.

## 2019-06-30 NOTE — Telephone Encounter (Signed)
Rx bactrim eRxd. Pt notified in [place].

## 2019-06-30 NOTE — Telephone Encounter (Signed)
Pt started taking UTI abx yesterday, has taken 2 doses and had a rash outbreak. Mentions she is feeling a little better as far as sx. She is aware you are out of the office until Monday and asks to send you msg. If you change her abx she would like a mychart notification pls.

## 2019-07-02 LAB — URINE CULTURE

## 2019-09-05 ENCOUNTER — Ambulatory Visit: Payer: BC Managed Care – PPO | Admitting: Obstetrics and Gynecology

## 2019-09-05 ENCOUNTER — Telehealth: Payer: Self-pay

## 2019-09-05 ENCOUNTER — Other Ambulatory Visit: Payer: Self-pay | Admitting: Obstetrics and Gynecology

## 2019-09-05 MED ORDER — SULFAMETHOXAZOLE-TRIMETHOPRIM 800-160 MG PO TABS: 1.0000 | ORAL_TABLET | Freq: Two times a day (BID) | ORAL | 0 refills | Status: AC

## 2019-09-05 NOTE — Telephone Encounter (Signed)
Pt had an appt scheduled with [place] this pm at 430, per ABC called pt to ask if she was having same sx from Feb visit so she can call a Rx in to her pharmacy. Pt says yes to same sx. ABC sent Rx in, called pt and left detailed msg of Rx sent to pharmacy per pt's request.

## 2019-09-05 NOTE — Progress Notes (Signed)
Rx bactrim for UTI sx of frequency, urgency, pelvic discomfort. Pos C&S with E. Coli 2/21. Has allergy to macrobid. F/u prn.

## 2020-01-30 ENCOUNTER — Ambulatory Visit: Payer: BC Managed Care – PPO | Admitting: Podiatry

## 2020-01-30 ENCOUNTER — Other Ambulatory Visit: Payer: Self-pay

## 2020-01-30 DIAGNOSIS — M216X9 Other acquired deformities of unspecified foot: Secondary | ICD-10-CM

## 2020-01-30 DIAGNOSIS — M722 Plantar fascial fibromatosis: Secondary | ICD-10-CM

## 2020-01-30 NOTE — Progress Notes (Signed)
   HPI: 54 y.o. female presenting today for evaluation of abnormal gait.  Patient was last seen on 06/28/2018 at which time she got custom molded orthotics from the Pedorthist.  She states that the orthotics did not help with her gait.  Currently she has no pain with ambulation however she is concerned that she walks on the outside of her feet.  She is concerned that this abnormal gait may stress her recent knee replacement.  She presents for further treatment and evaluation  Past Medical History:  Diagnosis Date  . BMI 40.0-44.9, adult ([place])   . Fibroid   . History of mammogram 12/07/2014; 12/09/15   CAT I; ADD VIEWS - BENIGN  . History of Papanicolaou smear of cervix 11/25/12; 12/09/15   -/-; -/-  . Joint pain   . 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 Menorrhagia   . PMDD (premenstrual dysphoric disorder) 06/2013   STARTED ON ZOLOFT  . Stress   . Vitamin D deficiency 11/2011     Physical Exam: General: The patient is alert and oriented x3 in no acute distress.  Dermatology: Skin is warm, dry and supple bilateral lower extremities. Negative for open lesions or macerations.  Vascular: Palpable pedal pulses bilaterally. No edema or erythema noted. Capillary refill within normal limits.  Neurological: Epicritic and protective threshold grossly intact bilaterally.   Musculoskeletal Exam: Range of motion within normal limits to all pedal and ankle joints bilateral. Muscle strength 5/5 in all groups bilateral.  Mild cavus foot type with lateral column loading during ambulation.  Negative for any significant pain with gait or palpation  Assessment: 1.  Cavus foot type bilateral 2.  Lateral column loading during gait/ambulation bilateral   Plan of Care:  1. Patient evaluated. 2.  Discontinue custom molded orthotics since they are not helping the patient correct her gait. 3.  Continue wearing Brooks running shoes 4.  Return to clinic as needed      Edrick Kins,  DPM [facility]  Dr. Edrick Kins, DPM    [address], [place] [address]                Office [phone]  Fax [phone]

## 2020-01-31 ENCOUNTER — Ambulatory Visit (INDEPENDENT_AMBULATORY_CARE_PROVIDER_SITE_OTHER): Payer: BC Managed Care – PPO | Admitting: Obstetrics and Gynecology

## 2020-01-31 ENCOUNTER — Encounter: Payer: Self-pay | Admitting: Obstetrics and Gynecology

## 2020-01-31 VITALS — BP 110/84 | Ht 63.0 in | Wt 255.0 lb

## 2020-01-31 DIAGNOSIS — Z01419 Encounter for gynecological examination (general) (routine) without abnormal findings: Secondary | ICD-10-CM

## 2020-01-31 DIAGNOSIS — Z1231 Encounter for screening mammogram for malignant neoplasm of breast: Secondary | ICD-10-CM | POA: Diagnosis not present

## 2020-01-31 DIAGNOSIS — N950 Postmenopausal bleeding: Secondary | ICD-10-CM

## 2020-01-31 NOTE — Patient Instructions (Signed)
I value your feedback and entrusting us with your care. If you get a [facility] patient survey, I would appreciate you taking the time to let us know about your experience today. Thank you!  As of April 20, 2019, your lab results will be released to your MyChart immediately, before I even have a chance to see them. Please give me time to review them and contact you if there are any abnormalities. Thank you for your patience.     [facility]: [phone]  

## 2020-01-31 NOTE — Progress Notes (Signed)
Chief Complaint  Patient presents with  . Gynecologic Exam    HPI:      [name] is a 54 y.o. [phone] who LMP was Patient's last menstrual period was 11/20/2016., presents today for her annual examination.  Her menses are absent due to menopause/hyst. She is s/p supracx hyst due to [facility] but had monthly, light spotting after procedure inially, but then no bleeding for several yrs. Dysmenorrhea none. She had 1 episode PMB after 2nd covid shot, no bleeding since.  She has tolerable vasomotor sx.   Sex activity: single partner, contraception - status post hysterectomy. She does not have vaginal dryness.  Last Pap: December 09, 2015  Results were: no abnormalities /neg HPV DNA.  Hx of STDs: none  Last mammogram: 04/05/19 at [facility] Results were: normal after addl views RT breast--routine follow-up in 12 months There is no FH of breast cancer. There is no FH of ovarian cancer. The patient does do self-breast exams.  Colonoscopy: 04/2018 with normal results; repeat due after 10 yrs  Tobacco use: The patient denies current or previous tobacco use. Alcohol use: none  No drug use Exercise: min active; s/p knee replacement  She does get adequate calcium and Vitamin D in her diet. PCP doing labs now.  Hx of UTIs with E.Coli on C&S in past.  Past Medical History:  Diagnosis Date  . BMI 40.0-44.9, adult ([place])   . Fibroid   . History of mammogram 12/07/2014; 12/09/15   CAT I; ADD VIEWS - BENIGN  . History of Papanicolaou smear of cervix 11/25/12; 12/09/15   -/-; -/-  . Joint pain   . 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 Menorrhagia   . PMDD (premenstrual dysphoric disorder) 06/2013   STARTED ON ZOLOFT  . Stress   . Vitamin D deficiency 11/2011    Past Surgical History:  Procedure Laterality Date  . ABDOMINAL HYSTERECTOMY  10/31/2009   TAH, SCHED AS LSH/CONVERTED TO TAH UTERIS W/O CERVIX 2ND TO FIBROIDS AND ENLARGED UTERUS  . COLONOSCOPY WITH PROPOFOL N/A  05/09/2018   Procedure: COLONOSCOPY WITH PROPOFOL;  Surgeon: Virgel Manifold, MD;  Location: [facility] ENDOSCOPY;  Service: Endoscopy;  Laterality: N/A;  . CRYOABLATION  03/14/2007   HER OPTIONS FOR MENORRHAGIA  . CYSTOSCOPY  10/31/2009   AFTER TAH TO ENSURE URETERAL PATENCY  . 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 Cancer Father        [facility]    Social History   Socioeconomic History  . Marital status: Married    Spouse name: Not on file  . Number of children: 3  . Years of education: 35  . Highest education level: Not on file  Occupational History  . Occupation: TEACHER  Tobacco Use  . Smoking status: Never Smoker  . Smokeless tobacco: Never Used  Vaping Use  . Vaping Use: Never used  Substance and Sexual Activity  . Alcohol use: No  . Drug use: No  . Sexual activity: Yes    Birth control/protection: None, Surgical    Comment: Hysterectomy  Other Topics Concern  . Not on file  Social History Narrative  . Not on file   Social Determinants of Health   Financial Resource Strain:   . Difficulty of Paying Living Expenses: Not on file  Food Insecurity:   . Worried About Charity fundraiser in the Last Year: Not on file  .  Ran Out of Food in the Last Year: Not on file  Transportation Needs:   . Lack of Transportation (Medical): Not on file  . Lack of Transportation (Non-Medical): Not on file  Physical Activity:   . Days of Exercise per Week: Not on file  . Minutes of Exercise per Session: Not on file  Stress:   . Feeling of Stress : Not on file  Social Connections:   . Frequency of Communication with Friends and Family: Not on file  . Frequency of Social Gatherings with Friends and Family: Not on file  . Attends Religious Services: Not on file  . Active Member of Clubs or Organizations: Not on file  . Attends Archivist Meetings: Not on file  . Marital Status: Not on file  Intimate Partner  Violence:   . Fear of Current or Ex-Partner: Not on file  . Emotionally Abused: Not on file  . Physically Abused: Not on file  . Sexually Abused: Not on file     Current Outpatient Medications:  .  albuterol (PROVENTIL HFA;VENTOLIN HFA) 108 (90 Base) MCG/ACT inhaler, Inhale 2 puffs into the lungs every 6 (six) hours as needed for wheezing or shortness of breath., Disp: 1 Inhaler, Rfl: 2 .  buPROPion (WELLBUTRIN XL) 150 MG 24 hr tablet, Take by mouth., Disp: , Rfl:  .  Cholecalciferol (VITAMIN D3) 1000 units CAPS, Take by mouth., Disp: , Rfl:  .  citalopram (CELEXA) 20 MG tablet, Take 20 mg by mouth daily., Disp: , Rfl:  .  cyclobenzaprine (FLEXERIL) 10 MG tablet, cyclobenzaprine 10 mg tablet, Disp: , Rfl:  .  FLOVENT HFA 110 MCG/ACT inhaler, Inhale 2 puffs into the lungs 2 (two) times daily., Disp: , Rfl: 9 .  Glucosamine-Chondroitin 250-200 MG TABS, Take by mouth., Disp: , Rfl:  .  KRILL OIL/ASTAXANTHIN 1000 MG CAPS, Take by mouth., Disp: , Rfl:  .  meloxicam (MOBIC) 15 MG tablet, meloxicam 15 mg tablet, Disp: , Rfl:  .  montelukast (SINGULAIR) 10 MG tablet, Take by mouth., Disp: , Rfl:  .  Multiple Vitamins-Minerals (QC WOMENS DAILY MULTIVITAMIN) TABS, Take by mouth., Disp: , Rfl:  .  Pyridoxine HCl (VITAMIN B-6) 250 MG tablet, Take 250 mg by mouth daily., Disp: , Rfl:  .  SODIUM & POTASSIUM BICARBONATE PO, Take by mouth., Disp: , Rfl:  .  traZODone (DESYREL) 100 MG tablet, Take by mouth., Disp: , Rfl:  .  vitamin B-12 (CYANOCOBALAMIN) 1000 MCG tablet, Take 1,000 mcg by mouth daily., Disp: , Rfl:  .  omeprazole (PRILOSEC) 20 MG capsule, Take by mouth daily as needed. , Disp: , Rfl:    ROS:  Review of Systems  Constitutional: Negative for fatigue, fever and unexpected weight change.  Respiratory: Negative for cough, shortness of breath and wheezing.   Cardiovascular: Negative for chest pain, palpitations and leg swelling.  Gastrointestinal: Negative for blood in stool, constipation,  diarrhea, nausea and vomiting.  Endocrine: Negative for cold intolerance, heat intolerance and polyuria.  Genitourinary: Negative for dyspareunia, dysuria, flank pain, frequency, genital sores, hematuria, menstrual problem, pelvic pain, urgency, vaginal bleeding, vaginal discharge and vaginal pain.  Musculoskeletal: Negative for back pain, joint swelling and myalgias.  Skin: Negative for rash.  Neurological: Negative for dizziness, syncope, light-headedness, numbness and headaches.  Hematological: Negative for adenopathy.  Psychiatric/Behavioral: Positive for agitation and dysphoric mood. Negative for confusion, sleep disturbance and suicidal ideas. The patient is not nervous/anxious.      Objective: BP 110/84   Ht  5\' 3"  (1.6 m)   Wt 255 lb (115.7 kg)   LMP 11/20/2016   BMI 45.17 kg/m    Physical Exam Constitutional:      Appearance: She is well-developed.  Genitourinary:     Vulva, vagina, cervix, right adnexa and left adnexa normal.     No vaginal discharge, erythema or tenderness.     Uterus is absent.     No right or left adnexal mass present.     Right adnexa not tender.     Left adnexa not tender.     Genitourinary Comments: UTERUS SURG REM  Neck:     Thyroid: No thyromegaly.  Cardiovascular:     Rate and Rhythm: Normal rate and regular rhythm.     Heart sounds: Normal heart sounds. No murmur heard.   Pulmonary:     Effort: Pulmonary effort is normal.     Breath sounds: Normal breath sounds.  Chest:     Breasts:        Right: No mass, nipple discharge, skin change or tenderness.        Left: No mass, nipple discharge, skin change or tenderness.  Abdominal:     Palpations: Abdomen is soft.     Tenderness: There is no abdominal tenderness. There is no guarding.  Musculoskeletal:        General: Normal range of motion.     Cervical back: Normal range of motion.  Neurological:     General: No focal deficit present.     Mental Status: She is alert and oriented to  person, place, and time.     Cranial Nerves: No cranial nerve deficit.  Skin:    General: Skin is warm and dry.  Psychiatric:        Mood and Affect: Mood normal.        Behavior: Behavior normal.        Thought Content: Thought content normal.        Judgment: Judgment normal.  Vitals reviewed.     Assessment/Plan:  Encounter for annual routine gynecological examination  Screening for breast cancer - Plan: MM 3D SCREEN BREAST BILATERAL--pt to sched mammo  PMB--1 episode after 2nd covid shot. F/u for further eval if sx persist.          GYN counsel mammography screening, adequate intake of calcium and vitamin D    F/U  Return in about 1 year (around 01/30/2021).  Reatha Sur B. Panzy Bubeck, PA-C 01/31/2020 4:36 PM

## 2020-07-29 ENCOUNTER — Encounter: Payer: Self-pay | Admitting: Obstetrics and Gynecology

## 2021-03-10 ENCOUNTER — Ambulatory Visit: Payer: BC Managed Care – PPO | Admitting: Obstetrics and Gynecology

## 2021-03-10 ENCOUNTER — Other Ambulatory Visit: Payer: Self-pay

## 2021-03-10 ENCOUNTER — Ambulatory Visit (INDEPENDENT_AMBULATORY_CARE_PROVIDER_SITE_OTHER): Payer: BC Managed Care – PPO | Admitting: Obstetrics and Gynecology

## 2021-03-10 ENCOUNTER — Other Ambulatory Visit (HOSPITAL_COMMUNITY)
Admission: RE | Admit: 2021-03-10 | Discharge: 2021-03-10 | Disposition: A | Discharge status: Home / Self Care | Payer: BC Managed Care – PPO | Source: Ambulatory Visit | Attending: Obstetrics and Gynecology | Admitting: Obstetrics and Gynecology

## 2021-03-10 ENCOUNTER — Encounter: Payer: Self-pay | Admitting: Obstetrics and Gynecology

## 2021-03-10 VITALS — BP 120/60 | Ht 63.0 in | Wt 245.0 lb

## 2021-03-10 DIAGNOSIS — Z124 Encounter for screening for malignant neoplasm of cervix: Secondary | ICD-10-CM | POA: Insufficient documentation

## 2021-03-10 DIAGNOSIS — Z01419 Encounter for gynecological examination (general) (routine) without abnormal findings: Secondary | ICD-10-CM | POA: Diagnosis not present

## 2021-03-10 DIAGNOSIS — Z1151 Encounter for screening for human papillomavirus (HPV): Secondary | ICD-10-CM | POA: Diagnosis not present

## 2021-03-10 DIAGNOSIS — Z1231 Encounter for screening mammogram for malignant neoplasm of breast: Secondary | ICD-10-CM

## 2021-03-10 NOTE — Progress Notes (Signed)
Chief Complaint  Patient presents with   Gynecologic Exam    No concerns    HPI:      [name] is a 55 y.o. [phone] who LMP was Patient's last menstrual period was 11/20/2016., presents today for her annual examination.  Her menses are absent due to menopause/hyst. She is s/p supracx hyst due to [facility] but had monthly, light spotting after procedure inially, then no bleeding for several yrs. Dysmenorrhea none. No PMB. She had 1 episode PMB after 2nd covid shot, no bleeding since.  She has occas tolerable vasomotor sx.   Sex activity: single partner, contraception - status post hysterectomy. She does not have vaginal dryness.  Last Pap: December 09, 2015  Results were: no abnormalities /neg HPV DNA. Still has cx.  Hx of STDs: none  Last mammogram: 07/25/20 at [facility] Results were: normal, routine follow-up in 12 months There is no FH of breast cancer. There is no FH of ovarian cancer. The patient does do self-breast exams.  Colonoscopy: 04/2018 with normal results; repeat due after 10 yrs  Tobacco use: The patient denies current or previous tobacco use. Alcohol use: none  No drug use Exercise: min active; s/p knee replacement  She does get adequate calcium and Vitamin D in her diet. PCP doing labs now.  Hx of UTIs with E.Coli on C&S in past.  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   reck 2014   Menorrhagia    PMDD (premenstrual dysphoric disorder) 06/2013   STARTED ON ZOLOFT   Stress    Vitamin D deficiency 11/2011    Past Surgical History:  Procedure Laterality Date   ABDOMINAL HYSTERECTOMY  10/31/2009   TAH, SCHED AS LSH/CONVERTED TO TAH UTERIS W/O CERVIX 2ND TO FIBROIDS AND ENLARGED UTERUS   COLONOSCOPY WITH PROPOFOL N/A 05/09/2018    Procedure: COLONOSCOPY WITH PROPOFOL;  Surgeon: Virgel Manifold, MD;  Location: [facility] ENDOSCOPY;  Service: Endoscopy;  Laterality: N/A;   CRYOABLATION  03/14/2007   HER OPTIONS FOR MENORRHAGIA   CYSTOSCOPY  10/31/2009   AFTER TAH TO ENSURE URETERAL PATENCY   REPLACEMENT TOTAL KNEE  11/2018    Family History  Problem Relation Age of Onset   Cancer Father        [facility]    Social History   Socioeconomic History   Marital status: Married    Spouse name: Not on file   Number of children: 3   Years of education: 16   Highest education level: Not on file  Occupational History   Occupation: TEACHER  Tobacco Use   Smoking status: Never   Smokeless tobacco: Never  Vaping Use   Vaping Use: Never used  Substance and Sexual Activity   Alcohol use: No   Drug use: No   Sexual activity: Yes    Birth control/protection: None, Surgical    Comment: Hysterectomy  Other Topics Concern   Not on file  Social History Narrative   Not on file   Social Determinants of Health   Financial Resource Strain: Not on file  Food Insecurity: Not on file  Transportation Needs: Not on file  Physical Activity: Not on file  Stress: Not on file  Social Connections: Not on file  Intimate Partner Violence: Not on file     Current Outpatient Medications:    albuterol (PROVENTIL HFA;VENTOLIN HFA) 108 (90 Base) MCG/ACT inhaler, Inhale 2 puffs into the lungs every 6 (six) hours as needed for wheezing or shortness of breath., Disp: 1 Inhaler, Rfl: 2   Cholecalciferol (VITAMIN D3) 1000 units CAPS, Take by mouth., Disp: , Rfl:    citalopram (CELEXA) 20 MG tablet, Take 20 mg by mouth daily., Disp: , Rfl:    cyclobenzaprine (FLEXERIL) 10 MG tablet, cyclobenzaprine 10 mg tablet, Disp: , Rfl:    FLOVENT HFA 110 MCG/ACT inhaler, Inhale 2 puffs into the lungs 2 (two) times daily., Disp: , Rfl: 9   Glucosamine-Chondroitin 250-200 MG TABS, Take by mouth., Disp: , Rfl:    meloxicam (MOBIC) 15 MG  tablet, meloxicam 15 mg tablet, Disp: , Rfl:    montelukast (SINGULAIR) 10 MG tablet, Take 10 mg by mouth at bedtime., Disp: , Rfl:    Multiple Vitamins-Minerals (QC WOMENS DAILY MULTIVITAMIN) TABS, Take by mouth., Disp: , Rfl:    traZODone (DESYREL) 150 MG tablet, Take 150 mg by mouth at bedtime., Disp: , Rfl:    buPROPion (WELLBUTRIN XL) 150 MG 24 hr tablet, Take by mouth., Disp: , Rfl:    omeprazole (PRILOSEC) 20 MG capsule, Take by mouth daily as needed. , Disp: , Rfl:    ROS:  Review of Systems  Constitutional:  Negative for fatigue, fever and unexpected weight change.  Respiratory:  Negative for cough, shortness of breath and wheezing.   Cardiovascular:  Negative for chest pain, palpitations and leg swelling.  Gastrointestinal:  Negative for blood in stool, constipation, diarrhea, nausea and vomiting.  Endocrine: Negative for cold intolerance, heat intolerance and polyuria.  Genitourinary:  Negative for dyspareunia, dysuria, flank pain, frequency, genital sores, hematuria, menstrual problem, pelvic pain, urgency, vaginal bleeding, vaginal discharge and vaginal pain.  Musculoskeletal:  Negative for back pain, joint swelling and myalgias.  Skin:  Negative for rash.  Neurological:  Negative for dizziness, syncope, light-headedness, numbness and headaches.  Hematological:  Negative for adenopathy.  Psychiatric/Behavioral:  Positive for agitation and dysphoric mood. Negative for confusion, sleep disturbance and suicidal ideas. The patient is not nervous/anxious.     Objective: BP 120/60   Ht 5\' 3"  (1.6 m)   Wt 245 lb (111.1 kg)   LMP 11/20/2016   BMI 43.40 kg/m    Physical Exam Constitutional:      Appearance: She is well-developed.  Genitourinary:     Vulva normal.     Genitourinary Comments: UTERUS SURG REM     No vaginal discharge, erythema or tenderness.      Right Adnexa: not tender and no mass present.    Left Adnexa: not tender and no mass present.    Uterus is  absent.  Breasts:    Right: No mass, nipple discharge, skin change or tenderness.     Left: No mass, nipple discharge, skin change or tenderness.  Neck:     Thyroid: No thyromegaly.  Cardiovascular:     Rate and Rhythm: Normal rate and regular rhythm.     Heart sounds: Normal heart sounds. No murmur heard. Pulmonary:     Effort: Pulmonary effort is normal.     Breath sounds: Normal breath sounds.  Abdominal:     Palpations: Abdomen is soft.     Tenderness: There is no abdominal tenderness. There is no guarding.  Musculoskeletal:  General: Normal range of motion.     Cervical back: Normal range of motion.  Neurological:     General: No focal deficit present.     Mental Status: She is alert and oriented to person, place, and time.     Cranial Nerves: No cranial nerve deficit.  Skin:    General: Skin is warm and dry.  Psychiatric:        Mood and Affect: Mood normal.        Behavior: Behavior normal.        Thought Content: Thought content normal.        Judgment: Judgment normal.  Vitals reviewed.    Assessment/Plan: Encounter for annual routine gynecological examination  Cervical cancer screening - Plan: Cytology - PAP  Screening for HPV (human papillomavirus) - Plan: Cytology - PAP  Encounter for screening mammogram for malignant neoplasm of breast - Plan: MM 3D SCREEN BREAST BILATERAL; pt to sched mammo 3/23          GYN counsel mammography screening, adequate intake of calcium and vitamin D    F/U  Return in about 1 year (around 03/10/2022).  Kaizen Ibsen B. Tayler Lassen, PA-C 03/10/2021 4:31 PM

## 2021-03-10 NOTE — Patient Instructions (Signed)
I value your feedback and you entrusting us with your care. If you get a [facility] patient survey, I would appreciate you taking the time to let us know about your experience today. Thank you!   [facility]: [phone]  

## 2021-03-12 LAB — CYTOLOGY - PAP
Comment: NEGATIVE
Diagnosis: NEGATIVE
High risk HPV: NEGATIVE

## 2021-03-18 ENCOUNTER — Ambulatory Visit (INDEPENDENT_AMBULATORY_CARE_PROVIDER_SITE_OTHER): Payer: BC Managed Care – PPO

## 2021-03-18 ENCOUNTER — Ambulatory Visit: Payer: BC Managed Care – PPO | Admitting: Podiatry

## 2021-03-18 ENCOUNTER — Other Ambulatory Visit: Payer: Self-pay

## 2021-03-18 DIAGNOSIS — L989 Disorder of the skin and subcutaneous tissue, unspecified: Secondary | ICD-10-CM

## 2021-03-18 DIAGNOSIS — S90851A Superficial foreign body, right foot, initial encounter: Secondary | ICD-10-CM | POA: Diagnosis not present

## 2021-03-18 NOTE — Progress Notes (Signed)
   HPI: 55 y.o. female presenting today for new complaint regarding pain and tenderness to the plantar aspect of the right foot.  Patient states that about 1 month ago she dropped some glass in her kitchen.  Subsequently she began to develop pain and tenderness to the lateral aspect of the foot and she believes she stepped on a piece of glass.  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  Dermatology: Skin is warm, dry and supple bilateral lower extremities. Negative for open lesions or macerations.  There is a hyperkeratotic lesion noted to the plantar aspect of the right rear foot.  Associated tenderness to palpation  Vascular: Palpable pedal pulses bilaterally. No edema or erythema noted. Capillary refill within normal limits.  Neurological: Epicritic and protective threshold grossly intact bilaterally.   Musculoskeletal Exam: No pedal deformity noted  Radiographic Exam:  Normal osseous mineralization. Joint spaces preserved. No fracture/dislocation/boney destruction.    Assessment: 1.  Foreign body glass right foot with callus   Plan of Care:  1. Patient evaluated. X-Rays reviewed.  2.  Excisional debridement of the callus tissue and the lesion was performed.  There was some glass that was felt with the scalpel blade that was used as it rubbed against the glass.  After debridement this was not observed.  I do believe the glass was removed with the callus and tissue that was  debrided 3.  Recommend salicylic acid daily x2 weeks.  Salicylic acid applied and provided 4.  Return to clinic as needed  *Going to [place], [place] in December for her nephew's wedding      Edrick Kins, DPM [facility]  Dr. Edrick Kins, DPM    [address], [place] [address]                Office [phone]  Fax [phone]

## 2021-07-29 ENCOUNTER — Encounter: Payer: Self-pay | Admitting: Obstetrics and Gynecology

## 2021-07-30 ENCOUNTER — Encounter: Payer: Self-pay | Admitting: Obstetrics and Gynecology

## 2022-05-22 ENCOUNTER — Ambulatory Visit (INDEPENDENT_AMBULATORY_CARE_PROVIDER_SITE_OTHER): Payer: BC Managed Care – PPO

## 2022-05-22 VITALS — BP 110/75 | HR 74 | Temp 99.1°F | Ht 63.0 in | Wt 254.7 lb

## 2022-05-22 DIAGNOSIS — R8290 Unspecified abnormal findings in urine: Secondary | ICD-10-CM

## 2022-05-22 DIAGNOSIS — R350 Frequency of micturition: Secondary | ICD-10-CM

## 2022-05-22 DIAGNOSIS — R399 Unspecified symptoms and signs involving the genitourinary system: Secondary | ICD-10-CM

## 2022-05-22 DIAGNOSIS — R102 Pelvic and perineal pain: Secondary | ICD-10-CM

## 2022-05-22 LAB — POCT URINALYSIS DIPSTICK
Bilirubin, UA: NEGATIVE
Glucose, UA: NEGATIVE
Ketones, UA: NEGATIVE
Nitrite, UA: NEGATIVE
Protein, UA: NEGATIVE
Spec Grav, UA: 1.015 (ref 1.010–1.025)
Urobilinogen, UA: 0.2 E.U./dL
pH, UA: 6 (ref 5.0–8.0)

## 2022-05-22 MED ORDER — SULFAMETHOXAZOLE-TRIMETHOPRIM 800-160 MG PO TABS: 1.0000 | ORAL_TABLET | Freq: Two times a day (BID) | ORAL | 1 refills | Status: DC

## 2022-05-22 NOTE — Progress Notes (Signed)
    NURSE VISIT NOTE  Subjective:    Patient ID: Sarah Lambert, female    DOB: 1965-11-22, 57 y.o.   MRN: [id number]       HPI  Patient is a 57 y.o. [phone] female who presents for urinary frequency, urinary urgency, abdominal pain, pelvic pain, and vaginal odor  for 1 day.  Patient does not have a history of recurrent UTI.  Patient does not have a history of pyelonephritis.    Objective:    BP 110/75   Pulse 74   Temp 99.1 F (37.3 C)   Ht '5\' 3"'$  (1.6 m)   Wt 254 lb 11.2 oz (115.5 kg)   LMP 11/20/2016   BMI 45.12 kg/m    Lab Review  Results for orders placed or performed in visit on 05/22/22  POCT Urinalysis Dipstick  Result Value Ref Range   Color, UA amber    Clarity, UA clear    Glucose, UA Negative Negative   Bilirubin, UA neg    Ketones, UA neg    Spec Grav, UA 1.015 1.010 - 1.025   Blood, UA small    pH, UA 6.0 5.0 - 8.0   Protein, UA Negative Negative   Urobilinogen, UA 0.2 0.2 or 1.0 E.U./dL   Nitrite, UA neg    Leukocytes, UA Small (1+) (A) Negative   Appearance     Odor      Assessment:   1. UTI symptoms   2. Urinary frequency   3. Abnormal urine odor   4. Pelvic pressure in female      Plan:   Urine Culture Sent. Treatment  Bactrim DS 1 PO BID for 7 days.   Marykay Lex, CMA

## 2022-05-27 LAB — URINE CULTURE

## 2022-07-22 NOTE — Progress Notes (Unsigned)
No chief complaint on file.   HPI:      [name] is a 57 y.o. [phone] who LMP was Patient's last menstrual period was 11/20/2016., presents today for her annual examination.  Her menses are absent due to menopause/hyst. She is s/p supracx hyst due to [facility] but had monthly, light spotting after procedure inially, then no bleeding for several yrs now. Dysmenorrhea none. No PMB. She had 1 episode PMB after 2nd covid shot, no bleeding since.  She has occas tolerable vasomotor sx.   Sex activity: single partner, contraception - status post hysterectomy. She does not have vaginal dryness.  Last Pap: 03/10/21  Results were: no abnormalities /neg HPV DNA. Still has cx.  Hx of STDs: none  Last mammogram: 07/29/21 at [facility] Results were: normal, routine follow-up in 12 months There is no FH of breast cancer. There is no FH of ovarian cancer. The patient does do self-breast exams.  Colonoscopy: 04/2018 with normal results; repeat due after 10 yrs  Tobacco use: The patient denies current or previous tobacco use. Alcohol use: none  No drug use Exercise: min active; s/p knee replacement  She does get adequate calcium and Vitamin D in her diet. PCP doing labs now.  Hx of UTIs with E.Coli on C&S in past.  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   reck 2014   Menorrhagia    PMDD (premenstrual dysphoric disorder) 06/2013   STARTED ON ZOLOFT   Stress    Vitamin D deficiency 11/2011    Past Surgical History:  Procedure Laterality Date   ABDOMINAL HYSTERECTOMY  10/31/2009   TAH, SCHED AS LSH/CONVERTED TO TAH UTERIS W/O CERVIX 2ND TO FIBROIDS AND ENLARGED UTERUS   COLONOSCOPY WITH PROPOFOL N/A 05/09/2018   Procedure: COLONOSCOPY WITH PROPOFOL;   Surgeon: Virgel Manifold, MD;  Location: [facility] ENDOSCOPY;  Service: Endoscopy;  Laterality: N/A;   CRYOABLATION  03/14/2007   HER OPTIONS FOR MENORRHAGIA   CYSTOSCOPY  10/31/2009   AFTER TAH TO ENSURE URETERAL PATENCY   REPLACEMENT TOTAL KNEE  11/2018    Family History  Problem Relation Age of Onset   Cancer Father        [facility]    Social History   Socioeconomic History   Marital status: Married    Spouse name: Not on file   Number of children: 3   Years of education: 16   Highest education level: Not on file  Occupational History   Occupation: TEACHER  Tobacco Use   Smoking status: Never   Smokeless tobacco: Never  Vaping Use   Vaping Use: Never used  Substance and Sexual Activity   Alcohol use: No   Drug use: No   Sexual activity: Yes    Birth control/protection: None, Surgical    Comment: Hysterectomy  Other Topics Concern   Not on file  Social History Narrative   Not on file   Social Determinants of Health   Financial Resource Strain: Not on file  Food Insecurity: Not on file  Transportation Needs: Not on file  Physical Activity: Not on file  Stress: Not on file  Social Connections: Not on file  Intimate Partner Violence: Not on file  Current Outpatient Medications:    albuterol (PROVENTIL HFA;VENTOLIN HFA) 108 (90 Base) MCG/ACT inhaler, Inhale 2 puffs into the lungs every 6 (six) hours as needed for wheezing or shortness of breath., Disp: 1 Inhaler, Rfl: 2   buPROPion (WELLBUTRIN XL) 150 MG 24 hr tablet, Take by mouth., Disp: , Rfl:    Cholecalciferol (VITAMIN D3) 1000 units CAPS, Take by mouth., Disp: , Rfl:    citalopram (CELEXA) 20 MG tablet, Take 20 mg by mouth daily., Disp: , Rfl:    cyclobenzaprine (FLEXERIL) 10 MG tablet, cyclobenzaprine 10 mg tablet, Disp: , Rfl:    FLOVENT HFA 110 MCG/ACT inhaler, Inhale 2 puffs into the lungs 2 (two) times daily., Disp: , Rfl: 9   Glucosamine-Chondroitin 250-200 MG TABS, Take by mouth.,  Disp: , Rfl:    meloxicam (MOBIC) 15 MG tablet, meloxicam 15 mg tablet, Disp: , Rfl:    montelukast (SINGULAIR) 10 MG tablet, Take 10 mg by mouth at bedtime., Disp: , Rfl:    Multiple Vitamins-Minerals (QC WOMENS DAILY MULTIVITAMIN) TABS, Take by mouth., Disp: , Rfl:    omeprazole (PRILOSEC) 20 MG capsule, Take by mouth daily as needed. , Disp: , Rfl:    sulfamethoxazole-trimethoprim (BACTRIM DS) 800-160 MG tablet, Take 1 tablet by mouth 2 (two) times daily., Disp: 14 tablet, Rfl: 1   traZODone (DESYREL) 150 MG tablet, Take 150 mg by mouth at bedtime., Disp: , Rfl:    ROS:  Review of Systems  Constitutional:  Negative for fatigue, fever and unexpected weight change.  Respiratory:  Negative for cough, shortness of breath and wheezing.   Cardiovascular:  Negative for chest pain, palpitations and leg swelling.  Gastrointestinal:  Negative for blood in stool, constipation, diarrhea, nausea and vomiting.  Endocrine: Negative for cold intolerance, heat intolerance and polyuria.  Genitourinary:  Negative for dyspareunia, dysuria, flank pain, frequency, genital sores, hematuria, menstrual problem, pelvic pain, urgency, vaginal bleeding, vaginal discharge and vaginal pain.  Musculoskeletal:  Negative for back pain, joint swelling and myalgias.  Skin:  Negative for rash.  Neurological:  Negative for dizziness, syncope, light-headedness, numbness and headaches.  Hematological:  Negative for adenopathy.  Psychiatric/Behavioral:  Positive for agitation and dysphoric mood. Negative for confusion, sleep disturbance and suicidal ideas. The patient is not nervous/anxious.      Objective: LMP 11/20/2016    Physical Exam Constitutional:      Appearance: She is well-developed.  Genitourinary:     Vulva normal.     Genitourinary Comments: UTERUS SURG REM     No vaginal discharge, erythema or tenderness.      Right Adnexa: not tender and no mass present.    Left Adnexa: not tender and no mass  present.    Uterus is absent.  Breasts:    Right: No mass, nipple discharge, skin change or tenderness.     Left: No mass, nipple discharge, skin change or tenderness.  Neck:     Thyroid: No thyromegaly.  Cardiovascular:     Rate and Rhythm: Normal rate and regular rhythm.     Heart sounds: Normal heart sounds. No murmur heard. Pulmonary:     Effort: Pulmonary effort is normal.     Breath sounds: Normal breath sounds.  Abdominal:     Palpations: Abdomen is soft.     Tenderness: There is no abdominal tenderness. There is no guarding.  Musculoskeletal:        General: Normal range of motion.     Cervical back: Normal range of motion.  Neurological:     General: No focal deficit present.     Mental Status: She is alert and oriented to person, place, and time.     Cranial Nerves: No cranial nerve deficit.  Skin:    General: Skin is warm and dry.  Psychiatric:        Mood and Affect: Mood normal.        Behavior: Behavior normal.        Thought Content: Thought content normal.        Judgment: Judgment normal.  Vitals reviewed.     Assessment/Plan: Encounter for annual routine gynecological examination  Cervical cancer screening - Plan: Cytology - PAP  Screening for HPV (human papillomavirus) - Plan: Cytology - PAP  Encounter for screening mammogram for malignant neoplasm of breast - Plan: MM 3D SCREEN BREAST BILATERAL; pt to sched mammo 3/23          GYN counsel mammography screening, adequate intake of calcium and vitamin D    F/U  No follow-ups on file.  Devarious Pavek B. Rifky Lapre, PA-C 07/22/2022 9:24 PM

## 2022-07-23 ENCOUNTER — Ambulatory Visit (INDEPENDENT_AMBULATORY_CARE_PROVIDER_SITE_OTHER): Payer: BC Managed Care – PPO | Admitting: Obstetrics and Gynecology

## 2022-07-23 ENCOUNTER — Encounter: Payer: Self-pay | Admitting: Obstetrics and Gynecology

## 2022-07-23 VITALS — BP 122/75 | HR 66 | Ht 62.0 in | Wt 266.0 lb

## 2022-07-23 DIAGNOSIS — Z01419 Encounter for gynecological examination (general) (routine) without abnormal findings: Secondary | ICD-10-CM

## 2022-07-23 DIAGNOSIS — Z1231 Encounter for screening mammogram for malignant neoplasm of breast: Secondary | ICD-10-CM

## 2022-07-23 NOTE — Patient Instructions (Signed)
I value your feedback and you entrusting us with your care. If you get a [facility] patient survey, I would appreciate you taking the time to let us know about your experience today. Thank you! ? ? ?

## 2022-09-14 LAB — HM MAMMOGRAPHY

## 2022-09-15 ENCOUNTER — Encounter: Payer: Self-pay | Admitting: Obstetrics and Gynecology

## 2022-10-14 ENCOUNTER — Encounter: Payer: Self-pay | Admitting: Obstetrics and Gynecology

## 2023-05-14 ENCOUNTER — Ambulatory Visit: Payer: Self-pay | Admitting: Internal Medicine

## 2023-08-24 NOTE — Progress Notes (Unsigned)
 No chief complaint on file.   HPI:      [name] is a 58 y.o. [phone] who LMP was Patient's last menstrual period was 11/20/2016., presents today for her annual examination.  Her menses are absent due to menopause/hyst. She is s/p supracx hyst due to [facility] but had monthly, light spotting after procedure inially, then no bleeding for several yrs now. Dysmenorrhea none. No PMB. She had 1 episode PMB after 2nd covid shot, no bleeding since.  She has occas tolerable vasomotor sx.   Sex activity: single partner, contraception - status post hysterectomy. She does not have vaginal dryness. Is having issues with decreased libido but also really tired from work.   Last Pap: 03/10/21  Results were: no abnormalities /neg HPV DNA. Still has cx.  Hx of STDs: none  Last mammogram: 09/14/22 at [facility] Results were: normal, routine follow-up in 12 months. There is no FH of breast cancer. There is no FH of ovarian cancer. The patient does do self-breast exams.  Colonoscopy: 04/2018 with normal results; repeat due after 10 yrs  Tobacco use: The patient denies current or previous tobacco use. Alcohol use: none  No drug use Exercise: min active; s/p knee replacement  She does get adequate calcium and Vitamin D in her diet. PCP doing labs now.  Hx of UTIs with E.Coli on C&S in past.  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   reck 2014   Menorrhagia    PMDD (premenstrual dysphoric disorder) 06/2013   STARTED ON ZOLOFT   Stress    Vitamin D deficiency 11/2011    Past Surgical History:  Procedure Laterality Date   ABDOMINAL HYSTERECTOMY  10/31/2009   TAH, SCHED AS LSH/CONVERTED TO TAH UTERIS W/O CERVIX 2ND TO FIBROIDS AND ENLARGED UTERUS   COLONOSCOPY WITH  PROPOFOL N/A 05/09/2018   Procedure: COLONOSCOPY WITH PROPOFOL;  Surgeon: Pasty Spillers, MD;  Location: [facility] ENDOSCOPY;  Service: Endoscopy;  Laterality: N/A;   CRYOABLATION  03/14/2007   HER OPTIONS FOR MENORRHAGIA   CYSTOSCOPY  10/31/2009   AFTER TAH TO ENSURE URETERAL PATENCY   REPLACEMENT TOTAL KNEE  11/2018    Family History  Problem Relation Age of Onset   Cancer Father        [facility]    Social History   Socioeconomic History   Marital status: Married    Spouse name: Not on file   Number of children: 3   Years of education: 16   Highest education level: Not on file  Occupational History   Occupation: TEACHER  Tobacco Use   Smoking status: Never   Smokeless tobacco: Never  Vaping Use   Vaping status: Never Used  Substance and Sexual Activity   Alcohol use: No   Drug use: No   Sexual activity: Yes    Birth control/protection: None, Surgical    Comment: Hysterectomy  Other Topics Concern   Not on file  Social History Narrative   Not on file   Social Drivers of Health   Financial Resource Strain: Low Risk  (07/19/2023)   Received from [facility]   Overall Financial Resource Strain (CARDIA)    Difficulty of Paying Living  Expenses: Not very hard  Food Insecurity: No Food Insecurity (07/19/2023)   Received from [facility]   Hunger Vital Sign    Worried About Running Out of Food in the Last Year: Never true    Ran Out of Food in the Last Year: Never true  Transportation Needs: No Transportation Needs (07/19/2023)   Received from [facility] - Transportation    In the past 12 months, has lack of transportation kept you from medical appointments or from getting medications?: No    Lack of Transportation (Non-Medical): No  Physical Activity: Not on file  Stress: Not on file  Social Connections: Not on file  Intimate Partner Violence: Not on file     Current Outpatient  Medications:    albuterol (PROVENTIL HFA;VENTOLIN HFA) 108 (90 Base) MCG/ACT inhaler, Inhale 2 puffs into the lungs every 6 (six) hours as needed for wheezing or shortness of breath., Disp: 1 Inhaler, Rfl: 2   Cholecalciferol (VITAMIN D3) 1000 units CAPS, Take by mouth., Disp: , Rfl:    citalopram (CELEXA) 20 MG tablet, Take 20 mg by mouth daily., Disp: , Rfl:    FLOVENT HFA 110 MCG/ACT inhaler, Inhale 2 puffs into the lungs 2 (two) times daily., Disp: , Rfl: 9   fluticasone (FLONASE) 50 MCG/ACT nasal spray, Place into the nose., Disp: , Rfl:    Glucosamine-Chondroitin 250-200 MG TABS, Take by mouth., Disp: , Rfl:    meloxicam (MOBIC) 15 MG tablet, meloxicam 15 mg tablet, Disp: , Rfl:    Misc Natural Products (IN-FLA-MEND) CAPS, Take by mouth., Disp: , Rfl:    montelukast (SINGULAIR) 10 MG tablet, Take 10 mg by mouth at bedtime., Disp: , Rfl:    Multiple Vitamins-Minerals (QC WOMENS DAILY MULTIVITAMIN) TABS, Take by mouth., Disp: , Rfl:    omeprazole (PRILOSEC) 20 MG capsule, Take by mouth daily as needed. , Disp: , Rfl:    traZODone (DESYREL) 150 MG tablet, Take 150 mg by mouth at bedtime., Disp: , Rfl:    ROS:  Review of Systems  Constitutional:  Negative for fatigue, fever and unexpected weight change.  Respiratory:  Negative for cough, shortness of breath and wheezing.   Cardiovascular:  Negative for chest pain, palpitations and leg swelling.  Gastrointestinal:  Negative for blood in stool, constipation, diarrhea, nausea and vomiting.  Endocrine: Negative for cold intolerance, heat intolerance and polyuria.  Genitourinary:  Negative for dyspareunia, dysuria, flank pain, frequency, genital sores, hematuria, menstrual problem, pelvic pain, urgency, vaginal bleeding, vaginal discharge and vaginal pain.  Musculoskeletal:  Negative for back pain, joint swelling and myalgias.  Skin:  Negative for rash.  Neurological:  Negative for dizziness, syncope, light-headedness, numbness and headaches.   Hematological:  Negative for adenopathy.  Psychiatric/Behavioral:  Negative for agitation, confusion, dysphoric mood, sleep disturbance and suicidal ideas. The patient is not nervous/anxious.      Objective: LMP 11/20/2016    Physical Exam Constitutional:      Appearance: She is well-developed.  Genitourinary:     Vulva normal.     Genitourinary Comments: UTERUS SURG REM     Right Labia: No rash, tenderness or lesions.    Left Labia: No tenderness, lesions or rash.    Vaginal cuff intact.    No vaginal discharge, erythema or tenderness.      Right Adnexa: not tender and no mass present.    Left Adnexa: not tender and no mass present.    No cervical friability or  polyp.     Uterus is absent.  Breasts:    Right: No mass, nipple discharge, skin change or tenderness.     Left: No mass, nipple discharge, skin change or tenderness.  Neck:     Thyroid: No thyromegaly.  Cardiovascular:     Rate and Rhythm: Normal rate and regular rhythm.     Heart sounds: Normal heart sounds. No murmur heard. Pulmonary:     Effort: Pulmonary effort is normal.     Breath sounds: Normal breath sounds.  Abdominal:     Palpations: Abdomen is soft.     Tenderness: There is no abdominal tenderness. There is no guarding.  Musculoskeletal:        General: Normal range of motion.     Cervical back: Normal range of motion.  Neurological:     General: No focal deficit present.     Mental Status: She is alert and oriented to person, place, and time.     Cranial Nerves: No cranial nerve deficit.  Skin:    General: Skin is warm and dry.  Psychiatric:        Mood and Affect: Mood normal.        Behavior: Behavior normal.        Thought Content: Thought content normal.        Judgment: Judgment normal.  Vitals reviewed.     Assessment/Plan: Encounter for annual routine gynecological examination  Encounter for screening mammogram for malignant neoplasm of breast - Plan: MM 3D SCREENING MAMMOGRAM  BILATERAL BREAST; pt to schedule mammo          GYN counsel mammography screening, adequate intake of calcium and vitamin D    F/U  No follow-ups on file.  Wassim Kirksey B. Gudrun Axe, PA-C 08/24/2023 12:43 PM

## 2023-08-26 ENCOUNTER — Encounter: Payer: Self-pay | Admitting: Obstetrics and Gynecology

## 2023-08-26 ENCOUNTER — Ambulatory Visit (INDEPENDENT_AMBULATORY_CARE_PROVIDER_SITE_OTHER): Payer: Self-pay | Admitting: Obstetrics and Gynecology

## 2023-08-26 VITALS — BP 123/81 | HR 80 | Ht 62.0 in | Wt 271.0 lb

## 2023-08-26 DIAGNOSIS — Z1231 Encounter for screening mammogram for malignant neoplasm of breast: Secondary | ICD-10-CM

## 2023-08-26 DIAGNOSIS — Z01419 Encounter for gynecological examination (general) (routine) without abnormal findings: Secondary | ICD-10-CM

## 2023-08-26 DIAGNOSIS — N951 Menopausal and female climacteric states: Secondary | ICD-10-CM

## 2023-08-26 MED ORDER — VEOZAH 45 MG PO TABS: 45.0000 mg | ORAL_TABLET | Freq: Every day | ORAL | 2 refills | Status: DC

## 2023-08-26 NOTE — Patient Instructions (Signed)
 I value your feedback and you entrusting [place] with your care. If you get a [facility] patient survey, I would appreciate you taking the time to let us know about your experience today. Thank you! ? ? ?

## 2023-09-16 ENCOUNTER — Encounter: Payer: Self-pay | Admitting: Obstetrics and Gynecology

## 2023-09-23 ENCOUNTER — Encounter: Payer: Self-pay | Admitting: Obstetrics and Gynecology

## 2023-09-24 ENCOUNTER — Other Ambulatory Visit

## 2023-09-24 DIAGNOSIS — N951 Menopausal and female climacteric states: Secondary | ICD-10-CM

## 2023-09-25 LAB — COMPREHENSIVE METABOLIC PANEL WITH GFR
ALT: 15 IU/L (ref 0–32)
AST: 19 IU/L (ref 0–40)
Albumin: 4.2 g/dL (ref 3.8–4.9)
Alkaline Phosphatase: 79 IU/L (ref 44–121)
BUN/Creatinine Ratio: 16 (ref 9–23)
BUN: 14 mg/dL (ref 6–24)
Bilirubin Total: 0.4 mg/dL (ref 0.0–1.2)
CO2: 22 mmol/L (ref 20–29)
Calcium: 9.2 mg/dL (ref 8.7–10.2)
Chloride: 105 mmol/L (ref 96–106)
Creatinine, Ser: 0.85 mg/dL (ref 0.57–1.00)
Globulin, Total: 2.3 g/dL (ref 1.5–4.5)
Glucose: 90 mg/dL (ref 70–99)
Potassium: 4.6 mmol/L (ref 3.5–5.2)
Sodium: 143 mmol/L (ref 134–144)
Total Protein: 6.5 g/dL (ref 6.0–8.5)
eGFR: 80 mL/min/{1.73_m2} (ref >59)

## 2023-09-26 ENCOUNTER — Ambulatory Visit: Payer: Self-pay | Admitting: Obstetrics and Gynecology

## 2023-09-26 NOTE — Addendum Note (Signed)
 Addended by: Alyn Judge B on: 09/26/2023 07:40 PM   Modules accepted: Orders

## 2023-10-26 ENCOUNTER — Other Ambulatory Visit

## 2023-10-26 DIAGNOSIS — N951 Menopausal and female climacteric states: Secondary | ICD-10-CM

## 2023-10-27 LAB — COMPREHENSIVE METABOLIC PANEL WITH GFR
ALT: 18 IU/L (ref 0–32)
AST: 22 IU/L (ref 0–40)
Albumin: 4.2 g/dL (ref 3.8–4.9)
Alkaline Phosphatase: 80 IU/L (ref 44–121)
BUN/Creatinine Ratio: 21 (ref 9–23)
BUN: 15 mg/dL (ref 6–24)
Bilirubin Total: 0.4 mg/dL (ref 0.0–1.2)
CO2: 21 mmol/L (ref 20–29)
Calcium: 9.7 mg/dL (ref 8.7–10.2)
Chloride: 103 mmol/L (ref 96–106)
Creatinine, Ser: 0.73 mg/dL (ref 0.57–1.00)
Globulin, Total: 2.9 g/dL (ref 1.5–4.5)
Glucose: 82 mg/dL (ref 70–99)
Potassium: 4.3 mmol/L (ref 3.5–5.2)
Sodium: 141 mmol/L (ref 134–144)
Total Protein: 7.1 g/dL (ref 6.0–8.5)
eGFR: 96 mL/min/{1.73_m2} (ref >59)

## 2023-11-15 ENCOUNTER — Other Ambulatory Visit: Payer: Self-pay | Admitting: Orthopedic Surgery

## 2023-11-15 DIAGNOSIS — M5416 Radiculopathy, lumbar region: Secondary | ICD-10-CM

## 2023-11-16 ENCOUNTER — Encounter: Payer: Self-pay | Admitting: Orthopedic Surgery

## 2023-11-17 ENCOUNTER — Other Ambulatory Visit: Payer: Self-pay | Admitting: Obstetrics and Gynecology

## 2023-11-17 DIAGNOSIS — N951 Menopausal and female climacteric states: Secondary | ICD-10-CM

## 2023-11-23 ENCOUNTER — Other Ambulatory Visit

## 2023-11-23 DIAGNOSIS — N951 Menopausal and female climacteric states: Secondary | ICD-10-CM

## 2023-11-24 ENCOUNTER — Inpatient Hospital Stay
Admission: RE | Admit: 2023-11-24 | Discharge: 2023-11-24 | Disposition: A | Discharge status: Home / Self Care | Source: Ambulatory Visit | Attending: Orthopedic Surgery

## 2023-11-24 DIAGNOSIS — M5416 Radiculopathy, lumbar region: Secondary | ICD-10-CM

## 2023-11-25 ENCOUNTER — Ambulatory Visit: Payer: Self-pay | Admitting: Obstetrics and Gynecology

## 2023-11-25 LAB — COMPREHENSIVE METABOLIC PANEL WITH GFR
ALT: 17 IU/L (ref 0–32)
AST: 22 IU/L (ref 0–40)
Albumin: 4.5 g/dL (ref 3.8–4.9)
Alkaline Phosphatase: 87 IU/L (ref 44–121)
BUN/Creatinine Ratio: 26 — ABNORMAL HIGH (ref 9–23)
BUN: 19 mg/dL (ref 6–24)
Bilirubin Total: 0.5 mg/dL (ref 0.0–1.2)
CO2: 17 mmol/L — ABNORMAL LOW (ref 20–29)
Calcium: 9.5 mg/dL (ref 8.7–10.2)
Chloride: 102 mmol/L (ref 96–106)
Creatinine, Ser: 0.72 mg/dL (ref 0.57–1.00)
Globulin, Total: 2.7 g/dL (ref 1.5–4.5)
Glucose: 92 mg/dL (ref 70–99)
Potassium: 4.6 mmol/L (ref 3.5–5.2)
Sodium: 141 mmol/L (ref 134–144)
Total Protein: 7.2 g/dL (ref 6.0–8.5)
eGFR: 97 mL/min/1.73 (ref >59)

## 2023-11-25 MED ORDER — VEOZAH 45 MG PO TABS: 45.0000 mg | ORAL_TABLET | Freq: Every day | ORAL | 8 refills | Status: DC

## 2023-11-25 NOTE — Addendum Note (Signed)
 Addended by: WATT HILA B on: 11/25/2023 09:38 AM   Modules accepted: Orders

## 2023-12-09 ENCOUNTER — Other Ambulatory Visit: Payer: Self-pay | Admitting: Family Medicine

## 2023-12-22 ENCOUNTER — Encounter: Payer: Self-pay | Admitting: Neurosurgery

## 2023-12-22 NOTE — Progress Notes (Unsigned)
 Referring Physician:  Franchot Houston, PA-C [address] [place],  [place] [address]  Primary Physician:  Franchot Houston, PA-C  History of Present Illness: 12/28/2023 [name] is here today with a chief complaint of persistent back pain despite prior treatments.  She experiences lower back pain for approximately one year, rated 1.5 to 2 when sedentary, worsening with inactivity and improving with movement. Sitting for extended periods causes buttock pain, and she has nocturnal shooting pains down both legs, disrupting sleep. Physical therapy and steroids have been ineffective, though an injection provided some relief. Discomfort is more pronounced in the left leg than the right.  She is able to walk for extended distances, but does have pain after some time.  Additionally, she does have difficulty with pain when she stands in 1 position. Bowel/Bladder Dysfunction: none  Conservative measures:  Physical therapy: has participated with [facility] and was discharged Multimodal medical therapy including regular antiinflammatories:  Tizanidine, Gabapentin, Celebrex, Meloxicam  Injections: 12/22/2023 ESI Bilateral L4-L5 ordered by [facility]-  05/27/23-Left Plantar Fascia Injection   Past Surgery: no spine surgery  [name] has no symptoms of cervical myelopathy.  The symptoms are causing a significant impact on the patient's life.   I have utilized the care everywhere function in epic to review the outside records available from external health systems.   Discussed the use of AI scribe software for clinical note transcription with the patient, who gave verbal consent to proceed.       Review of Systems:  A 10 point review of systems is negative, except for the pertinent positives and negatives detailed in the HPI.  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   reck 2014   Menorrhagia    PMDD (premenstrual dysphoric disorder) 06/2013   STARTED ON ZOLOFT   Stress    Vitamin D deficiency 11/2011    Past Surgical History: Past Surgical History:  Procedure Laterality Date   ABDOMINAL HYSTERECTOMY  10/31/2009   TAH, SCHED AS LSH/CONVERTED TO TAH UTERIS W/O CERVIX 2ND TO FIBROIDS AND ENLARGED UTERUS   COLONOSCOPY WITH PROPOFOL  N/A 05/09/2018   Procedure: COLONOSCOPY WITH PROPOFOL ;  Surgeon: Janalyn Keene NOVAK, MD;  Location: [facility] ENDOSCOPY;  Service: Endoscopy;  Laterality: N/A;   CRYOABLATION  03/14/2007   HER OPTIONS FOR MENORRHAGIA   CYSTOSCOPY  10/31/2009   AFTER TAH TO ENSURE URETERAL PATENCY   KNEE ARTHROSCOPY     REPLACEMENT TOTAL KNEE  11/2018    Allergies: Allergies as of 12/28/2023 - Review Complete 12/27/2023  Allergen Reaction Noted   Clarithromycin Other (See Comments) 10/21/2022   Augmentin [amoxicillin-pot clavulanate] Other (See Comments) 02/02/2014   Levofloxacin Other (See Comments) 02/23/2017   Macrobid  [nitrofurantoin ] Rash 06/30/2019    Medications:  Current Outpatient Medications:    albuterol  (PROVENTIL  HFA;VENTOLIN  HFA) 108 (90 Base) MCG/ACT inhaler, Inhale 2 puffs into the lungs every 6 (six) hours as needed for wheezing or shortness of breath., Disp: 1 Inhaler, Rfl: 2   Cholecalciferol (VITAMIN D3) 1000 units CAPS, Take by mouth., Disp: , Rfl:    FLOVENT HFA 110 MCG/ACT inhaler, Inhale 2 puffs into the lungs 2 (two) times daily.,  Disp: , Rfl: 9   meclizine (ANTIVERT) 12.5 MG tablet, Take by mouth., Disp: , Rfl:    metroNIDAZOLE  (METROCREAM ) 0.75 % cream, Apply to face QD for rosacea, Disp: 45 g, Rfl: 2   Multiple Vitamins-Minerals (QC WOMENS DAILY MULTIVITAMIN) TABS, Take by mouth., Disp: , Rfl:    nortriptyline (PAMELOR) 10 MG  capsule, Take by mouth., Disp: , Rfl:    omeprazole (PRILOSEC) 20 MG capsule, Take by mouth daily as needed. , Disp: , Rfl:    traZODone (DESYREL) 150 MG tablet, Take 150 mg by mouth at bedtime., Disp: , Rfl:   Social History: Social History   Tobacco Use   Smoking status: Never   Smokeless tobacco: Never  Vaping Use   Vaping status: Never Used  Substance Use Topics   Alcohol use: No   Drug use: No    Family Medical History: Family History  Problem Relation Age of Onset   Cancer Father        [facility]    Physical Examination: Vitals:   12/28/23 1101  BP: 130/72    General: Patient is in no apparent distress. Attention to examination is appropriate.  Neck:   Supple.  Full range of motion.  Respiratory: Patient is breathing without any difficulty.   NEUROLOGICAL:     Awake, alert, oriented to person, place, and time.  Speech is clear and fluent.   Cranial Nerves: Pupils equal round and reactive to light.  Facial tone is symmetric.  Facial sensation is symmetric. Shoulder shrug is symmetric. Tongue protrusion is midline.  There is no pronator drift.  Strength: Side Biceps Triceps Deltoid Interossei Grip Wrist Ext. Wrist Flex.  R 5 5 5 5 5 5 5   L 5 5 5 5 5 5 5    Side Iliopsoas Quads Hamstring PF DF EHL  R 5 5 5 5 5 5   L 5 5 5 5 5 5    Reflexes are 1+ and symmetric at the biceps, triceps, brachioradialis, patella and achilles.   Hoffman's is absent.   Bilateral upper and lower extremity sensation is intact to light touch.    No evidence of dysmetria noted.  Gait is normal.     Medical Decision Making  Imaging: MR L spine 11/24/2023 IMPRESSION: 1. Grade 1 anterolisthesis of L4 on L5 with associated Modic type 1 degenerative endplate changes. 2. Moderate-to-severe spinal canal stenosis at L4-5 with severe narrowing of the left lateral recess. No significant neural foraminal narrowing. 3. Mild spinal canal stenosis at L3-4.   Electronically  signed by: Ryan Chess MD 11/26/2023 01:44 PM EDT RP Workstation: [id number]  I have personally reviewed the images and agree with the above interpretation.  Assessment and Plan: [name] is a pleasant 58 y.o. female with spondylolisthesis of L4 and L5.  This is causing severe narrowing of her left lateral recess.  She has back pain with left-sided sciatica.  She has developed a significant spondylolisthesis.  This is likely due to degenerative disc disease and arthritic changes over time.  She has tried and failed physical therapy.  I think she is an excellent candidate for surgical intervention.  Prior to pursuing this intervention, I have recommended that she try to achieve a goal weight of approximately 240 pounds.  She will discuss this with her primary care provider.  I will see her back in approximately 3 months.  I spent a total of 30 minutes in this patient's care today. This time was spent reviewing pertinent records including imaging studies,  obtaining and confirming history, performing a directed evaluation, formulating and discussing my recommendations, and documenting the visit within the medical record.      Thank you for involving me in the care of this patient.      Kobi Aller K. Clois MD, [facility] Neurosurgery

## 2023-12-27 ENCOUNTER — Ambulatory Visit

## 2023-12-27 DIAGNOSIS — L814 Other melanin hyperpigmentation: Secondary | ICD-10-CM

## 2023-12-27 DIAGNOSIS — L710 Perioral dermatitis: Secondary | ICD-10-CM

## 2023-12-27 DIAGNOSIS — Z1283 Encounter for screening for malignant neoplasm of skin: Secondary | ICD-10-CM | POA: Diagnosis not present

## 2023-12-27 DIAGNOSIS — L738 Other specified follicular disorders: Secondary | ICD-10-CM

## 2023-12-27 DIAGNOSIS — W908XXA Exposure to other nonionizing radiation, initial encounter: Secondary | ICD-10-CM

## 2023-12-27 DIAGNOSIS — L821 Other seborrheic keratosis: Secondary | ICD-10-CM | POA: Diagnosis not present

## 2023-12-27 DIAGNOSIS — D229 Melanocytic nevi, unspecified: Secondary | ICD-10-CM

## 2023-12-27 DIAGNOSIS — D1801 Hemangioma of skin and subcutaneous tissue: Secondary | ICD-10-CM

## 2023-12-27 DIAGNOSIS — L578 Other skin changes due to chronic exposure to nonionizing radiation: Secondary | ICD-10-CM

## 2023-12-27 MED ORDER — METRONIDAZOLE 0.75 % EX CREA: TOPICAL_CREAM | CUTANEOUS | 2 refills | Status: DC

## 2023-12-27 NOTE — Progress Notes (Signed)
   New Patient Visit   Subjective  Sarah Lambert is a 58 y.o. female who presents for the following: Skin Cancer Screening and Full Body Skin Exam breaking out on face hard bumps, ~77m comes and goes, no treatment, no fhx of skin cancer, hx of bx on the R shoulder, but pathology benign per patient.  The patient presents for Total-Body Skin Exam (TBSE) for skin cancer screening and mole check. The patient has spots, moles and lesions to be evaluated, some may be new or changing and the patient may have concern these could be cancer.  The following portions of the chart were reviewed this encounter and updated as appropriate: medications, allergies, medical history  Review of Systems:  No other skin or systemic complaints except as noted in HPI or Assessment and Plan.  Objective  Well appearing patient in no apparent distress; mood and affect are within normal limits.  A full examination was performed including scalp, head, eyes, ears, nose, lips, neck, chest, axillae, abdomen, back, buttocks, bilateral upper extremities, bilateral lower extremities, hands, feet, fingers, toes, fingernails, and toenails. All findings within normal limits unless otherwise noted below.   Relevant physical exam findings are noted in the Assessment and Plan.    Assessment & Plan   SKIN CANCER SCREENING PERFORMED TODAY.  ACTINIC DAMAGE - Chronic condition, secondary to cumulative UV/sun exposure - diffuse scaly erythematous macules with underlying dyspigmentation - Recommend daily broad spectrum sunscreen SPF 30+ to sun-exposed areas, reapply every 2 hours as needed.  - Staying in the shade or wearing long sleeves, sun glasses (UVA+UVB protection) and wide brim hats (4-inch brim around the entire circumference of the hat) are also recommended for sun protection.  - Call for new or changing lesions.  LENTIGINES, SEBORRHEIC KERATOSES, HEMANGIOMAS - Benign normal skin lesions - Benign-appearing - Call for  any changes  MELANOCYTIC NEVI - Tan-brown and/or pink-flesh-colored symmetric macules and papules - Benign appearing on exam today - Observation - Call clinic for new or changing moles - Recommend daily use of broad spectrum spf 30+ sunscreen to sun-exposed areas.    Sebaceous Hyperplasia - Small yellow papules with a central dell - Benign-appearing - Observe. Call for changes.  Perioral (periorificial) dermatitis - flaring, not at treatment goal  - Explained to patient that its a common facial skin problem in which groups of itchy or tender small red papules (bumps) appear around the mouth. - Recommended patient discontinue applying all face creams including topical steroids, cosmetics - The rash may get worse for a few days before it starts to improve. - Start topical metronidazole  gel once daily    Return for rosacea follow up in 6-8 weeks.  LILLETTE Rosina Mayans, CMA, am acting as scribe for Lauraine JAYSON Kanaris, MD .  Documentation: I have reviewed the above documentation for accuracy and completeness, and I agree with the above.  Lauraine JAYSON Kanaris, MD

## 2023-12-27 NOTE — Patient Instructions (Signed)

## 2023-12-28 ENCOUNTER — Encounter: Payer: Self-pay | Admitting: Neurosurgery

## 2023-12-28 ENCOUNTER — Ambulatory Visit: Admitting: Neurosurgery

## 2023-12-28 VITALS — BP 130/72 | Ht 63.0 in | Wt 276.0 lb

## 2023-12-28 DIAGNOSIS — M5442 Lumbago with sciatica, left side: Secondary | ICD-10-CM

## 2023-12-28 DIAGNOSIS — M4316 Spondylolisthesis, lumbar region: Secondary | ICD-10-CM | POA: Diagnosis not present

## 2023-12-28 DIAGNOSIS — G8929 Other chronic pain: Secondary | ICD-10-CM

## 2024-01-20 ENCOUNTER — Encounter: Payer: Self-pay | Admitting: Obstetrics and Gynecology

## 2024-01-27 ENCOUNTER — Other Ambulatory Visit: Payer: Self-pay | Admitting: Obstetrics and Gynecology

## 2024-01-27 MED ORDER — ESTRADIOL 0.05 MG/24HR TD PTTW: 1.0000 | MEDICATED_PATCH | TRANSDERMAL | 0 refills | Status: DC

## 2024-01-27 NOTE — Progress Notes (Signed)
 Rx vivelle  dot for VS sx; s/p hyst

## 2024-02-14 ENCOUNTER — Ambulatory Visit

## 2024-02-16 ENCOUNTER — Ambulatory Visit

## 2024-02-29 ENCOUNTER — Ambulatory Visit: Admitting: Neurosurgery

## 2024-03-09 ENCOUNTER — Ambulatory Visit (INDEPENDENT_AMBULATORY_CARE_PROVIDER_SITE_OTHER)

## 2024-03-09 VITALS — BP 138/86 | HR 64 | Temp 97.6°F | Ht 62.0 in | Wt 271.6 lb

## 2024-03-09 DIAGNOSIS — N898 Other specified noninflammatory disorders of vagina: Secondary | ICD-10-CM

## 2024-03-09 DIAGNOSIS — R339 Retention of urine, unspecified: Secondary | ICD-10-CM

## 2024-03-09 DIAGNOSIS — R351 Nocturia: Secondary | ICD-10-CM

## 2024-03-09 LAB — POCT URINALYSIS DIPSTICK
Appearance: NORMAL
Bilirubin, UA: NEGATIVE
Blood, UA: NEGATIVE
Glucose, UA: NEGATIVE
Ketones, UA: NEGATIVE
Leukocytes, UA: NEGATIVE
Nitrite, UA: NEGATIVE
Odor: NORMAL
Protein, UA: NEGATIVE
Spec Grav, UA: 1.02 (ref 1.010–1.025)
Urobilinogen, UA: 0.2 U/dL
pH, UA: 5 (ref 5.0–8.0)

## 2024-03-09 NOTE — Progress Notes (Signed)
    NURSE VISIT NOTE  Subjective:    Patient ID: Sarah Lambert, female    DOB: 1965/07/12, 58 y.o.   MRN: [id number]       HPI  Patient is a 58 y.o. [phone] female who presents for nocturia and incomplete bladder emptying for 4 days.  Patient denies dysuria, hematuria, urinary frequency, urinary urgency, flank pain, abdominal pain, pelvic pain, cloudy malordorous urine, genital rash, genital irritation, and vaginal discharge.  Patient does not have a history of recurrent UTI.  Patient does not have a history of pyelonephritis. She reports a fishy odor when she wipes, but denies vaginal discharge. Denies abnormal vaginal bleeding or significant pelvic pain or fever. Patient denies a history of known exposure to STD.  Objective:    BP 138/86   Pulse 64   Temp 97.6 F (36.4 C)   Ht 5' 2 (1.575 m)   Wt 271 lb 9.6 oz (123.2 kg)   LMP 11/20/2016   BMI 49.68 kg/m    Lab Review  Results for orders placed or performed in visit on 03/09/24  POCT Urinalysis Dipstick  Result Value Ref Range   Color, UA amber    Clarity, UA clear    Glucose, UA Negative Negative   Bilirubin, UA Negative    Ketones, UA Negative    Spec Grav, UA 1.020 1.010 - 1.025   Blood, UA Negative    pH, UA 5.0 5.0 - 8.0   Protein, UA Negative Negative   Urobilinogen, UA 0.2 0.2 or 1.0 E.U./dL   Nitrite, UA Negative    Leukocytes, UA Negative Negative   Appearance normal    Odor normal     Assessment:   1. Nocturia   2. Incomplete bladder emptying   3. Vaginal odor      Plan:   Nu Swab sent to lab. Treatment: Will await results Urine Culture Sent. Maintain adequate hydration.  Follow up if symptoms worsen or fail to improve as anticipated, and as needed.  Nu Swab sent to lab. Treatment: Will await results   Camelia Bars, LPN

## 2024-03-11 LAB — NUSWAB VG PLUS+MYCOPLASMAS,NAA
Candida albicans, NAA: NEGATIVE
Candida glabrata, NAA: NEGATIVE
Chlamydia trachomatis, NAA: NEGATIVE
Mycoplasma genitalium NAA: NEGATIVE
Mycoplasma hominis NAA: NEGATIVE
Neisseria gonorrhoeae, NAA: NEGATIVE
Trich vag by NAA: NEGATIVE
Ureaplasma spp NAA: NEGATIVE

## 2024-03-12 ENCOUNTER — Ambulatory Visit: Payer: Self-pay | Admitting: Obstetrics

## 2024-03-12 LAB — URINE CULTURE

## 2024-05-02 ENCOUNTER — Ambulatory Visit: Admitting: Neurosurgery

## 2024-05-02 ENCOUNTER — Encounter: Payer: Self-pay | Admitting: Neurosurgery

## 2024-05-02 VITALS — BP 120/78 | Wt 252.0 lb

## 2024-05-02 DIAGNOSIS — M5442 Lumbago with sciatica, left side: Secondary | ICD-10-CM

## 2024-05-02 DIAGNOSIS — G8929 Other chronic pain: Secondary | ICD-10-CM

## 2024-05-02 DIAGNOSIS — M4316 Spondylolisthesis, lumbar region: Secondary | ICD-10-CM

## 2024-05-02 NOTE — Progress Notes (Signed)
 "   Referring Physician:  Franchot Houston, PA-C [address] [place],  [place] [address]  Primary Physician:  Franchot Houston, PA-C  History of Present Illness: 05/02/2024 Sarah Lambert returns to see me.  She has lost 20 pounds since I last saw her.  She had an ablation which has helped tremendously.  She still has pain in her buttock.  She is still having trouble with standing and walking as noted below.   12/28/2023 Sarah Lambert is here today with a chief complaint of persistent back pain despite prior treatments.  She experiences lower back pain for approximately one year, rated 1.5 to 2 when sedentary, worsening with inactivity and improving with movement. Sitting for extended periods causes buttock pain, and she has nocturnal shooting pains down both legs, disrupting sleep. Physical therapy and steroids have been ineffective, though an injection provided some relief. Discomfort is more pronounced in the left leg than the right.  She is able to walk for extended distances, but does have pain after some time.  Additionally, she does have difficulty with pain when she stands in 1 position. Bowel/Bladder Dysfunction: none  Conservative measures:  Physical therapy: has participated with [facility] and was discharged Multimodal medical therapy including regular antiinflammatories:  Tizanidine, Gabapentin, Celebrex, Meloxicam  Injections: 12/22/2023 ESI Bilateral L4-L5 ordered by [facility]-  05/27/23-Left Plantar Fascia Injection   Past Surgery: no spine surgery  Sarah Lambert has no symptoms of cervical myelopathy.  The symptoms are causing a significant impact on the patient's life.   I have utilized the care everywhere function in epic to review the outside records available from external health systems.   Discussed the use of AI scribe software for clinical note transcription with the patient, who gave verbal consent to proceed.        Review of Systems:  A 10 point review of systems is negative, except for the pertinent positives and negatives detailed in the HPI.  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   reck 2014   Menorrhagia    PMDD (premenstrual dysphoric disorder) 06/2013   STARTED ON ZOLOFT   Stress    Vitamin D deficiency 11/2011    Past Surgical History: Past Surgical History:  Procedure Laterality Date   ABDOMINAL HYSTERECTOMY  10/31/2009   TAH, SCHED AS LSH/CONVERTED TO TAH UTERIS W/O CERVIX 2ND TO FIBROIDS AND ENLARGED UTERUS   COLONOSCOPY WITH PROPOFOL  N/A 05/09/2018   Procedure: COLONOSCOPY WITH PROPOFOL ;  Surgeon: Janalyn Keene NOVAK, MD;  Location: [facility] ENDOSCOPY;  Service: Endoscopy;  Laterality: N/A;   CRYOABLATION  03/14/2007   HER OPTIONS FOR MENORRHAGIA   CYSTOSCOPY  10/31/2009   AFTER TAH TO ENSURE URETERAL PATENCY   KNEE ARTHROSCOPY     REPLACEMENT TOTAL KNEE  11/2018    Allergies: Allergies as of 05/02/2024 - Review Complete 03/09/2024  Allergen Reaction Noted   Clarithromycin Other (See Comments) 10/21/2022   Augmentin [amoxicillin-pot clavulanate] Other (See Comments) 02/02/2014   Levofloxacin Other (See Comments) 02/23/2017   Macrobid  [nitrofurantoin ] Rash 06/30/2019    Medications:  Current Outpatient Medications:    albuterol  (PROVENTIL  HFA;VENTOLIN  HFA) 108 (90 Base) MCG/ACT inhaler, Inhale 2 puffs into  the lungs every 6 (six) hours as needed for wheezing or shortness of breath., Disp: 1 Inhaler, Rfl: 2   Cholecalciferol (VITAMIN D3) 1000 units CAPS, Take by mouth., Disp: , Rfl:    estradiol  (VIVELLE -DOT) 0.05 MG/24HR patch, Place 1 patch (0.05 mg total) onto the skin 2 (two) times a week., Disp: 24 patch, Rfl:  0   FLOVENT HFA 110 MCG/ACT inhaler, Inhale 2 puffs into the lungs 2 (two) times daily., Disp: , Rfl: 9   meclizine (ANTIVERT) 12.5 MG tablet, Take by mouth., Disp: , Rfl:    metroNIDAZOLE  (METROCREAM ) 0.75 % cream, Apply to face QD for rosacea (Patient not taking: Reported on 03/09/2024), Disp: 45 g, Rfl: 2   Multiple Vitamins-Minerals (QC WOMENS DAILY MULTIVITAMIN) TABS, Take by mouth., Disp: , Rfl:    nortriptyline (PAMELOR) 10 MG capsule, Take by mouth., Disp: , Rfl:    omeprazole (PRILOSEC) 20 MG capsule, Take by mouth daily as needed. , Disp: , Rfl:    traZODone (DESYREL) 150 MG tablet, Take 150 mg by mouth at bedtime., Disp: , Rfl:   Social History: Social History   Tobacco Use   Smoking status: Never   Smokeless tobacco: Never  Vaping Use   Vaping status: Never Used  Substance Use Topics   Alcohol use: No   Drug use: No    Family Medical History: Family History  Problem Relation Age of Onset   Cancer Father        [facility]    Physical Examination: There were no vitals filed for this visit.   General: Patient is in no apparent distress. Attention to examination is appropriate.  Neck:   Supple.  Full range of motion.  Respiratory: Patient is breathing without any difficulty.   NEUROLOGICAL:     Awake, alert, oriented to person, place, and time.  Speech is clear and fluent.   Cranial Nerves: Appear intact Moves all extremities well  Medical Decision Making  Imaging: MR L spine 11/24/2023 IMPRESSION: 1. Grade 1 anterolisthesis of L4 on L5 with associated Modic type 1 degenerative endplate changes. 2. Moderate-to-severe spinal canal stenosis at L4-5 with severe narrowing of the left lateral recess. No significant neural foraminal narrowing. 3. Mild spinal canal stenosis at L3-4.   Electronically signed by: Ryan Chess MD 11/26/2023 01:44 PM EDT RP Workstation: [id number]  I have personally reviewed the images and agree with the above  interpretation.  Assessment and Plan: Sarah Lambert is a pleasant 58 y.o. female with spondylolisthesis of L4 and L5.  This is causing severe narrowing of her left lateral recess.  She has back pain with left-sided sciatica.  She has developed a significant spondylolisthesis.  This is likely due to degenerative disc disease and arthritic changes over time.  She has tried and failed physical therapy.  I think she is an excellent candidate for surgical intervention.  Prior to pursuing this intervention, I have recommended that she try to achieve a goal weight of approximately 240 pounds.  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  I will see her back in April.  She will continue to work on losing weight.  She has made great progress.  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  I spent a total of 4 minutes in this patient's care today. This time was spent reviewing pertinent records including imaging studies, obtaining and confirming history, performing a directed evaluation, formulating and discussing my recommendations, and documenting the  visit within the medical record.      Thank you for involving me in the care of this patient.      Ankita Newcomer K. Clois MD, [facility]  "

## 2024-08-15 ENCOUNTER — Ambulatory Visit: Admitting: Neurosurgery
